# Patient Record
Sex: Male | Born: 1955
Health system: Southern US, Community
[De-identification: ages and names within clinical notes are randomized; demographics above are authoritative.]

## PROBLEM LIST (undated history)

## (undated) DIAGNOSIS — I10 Essential (primary) hypertension: Secondary | ICD-10-CM

## (undated) DIAGNOSIS — Z8739 Personal history of other diseases of the musculoskeletal system and connective tissue: Secondary | ICD-10-CM

## (undated) DIAGNOSIS — E118 Type 2 diabetes mellitus with unspecified complications: Secondary | ICD-10-CM

## (undated) DIAGNOSIS — R7303 Prediabetes: Secondary | ICD-10-CM

## (undated) DIAGNOSIS — E785 Hyperlipidemia, unspecified: Secondary | ICD-10-CM

## (undated) HISTORY — DX: Essential (primary) hypertension: I10

## (undated) HISTORY — DX: Type 2 diabetes mellitus with unspecified complications: E11.8

## (undated) HISTORY — DX: Personal history of other diseases of the musculoskeletal system and connective tissue: Z87.39

## (undated) HISTORY — PX: FRACTURE SURGERY: SHX138

## (undated) HISTORY — DX: Prediabetes: R73.03

## (undated) HISTORY — DX: Hyperlipidemia, unspecified: E78.5

---

## 1991-02-02 HISTORY — PX: VASECTOMY: SHX75

## 2010-03-02 ENCOUNTER — Ambulatory Visit: Payer: Self-pay | Admitting: Internal Medicine

## 2010-05-22 ENCOUNTER — Other Ambulatory Visit: Payer: Self-pay | Admitting: Surgery

## 2010-05-22 DIAGNOSIS — R590 Localized enlarged lymph nodes: Secondary | ICD-10-CM

## 2010-05-27 ENCOUNTER — Ambulatory Visit
Admission: RE | Admit: 2010-05-27 | Discharge: 2010-05-27 | Disposition: A | Discharge status: Home / Self Care | Payer: Federal, State, Local not specified - PPO | Source: Ambulatory Visit | Attending: Surgery | Admitting: Surgery

## 2010-05-27 ENCOUNTER — Other Ambulatory Visit: Payer: Self-pay | Admitting: Surgery

## 2010-05-27 DIAGNOSIS — R590 Localized enlarged lymph nodes: Secondary | ICD-10-CM

## 2015-04-18 ENCOUNTER — Encounter: Payer: Self-pay | Admitting: Family Medicine

## 2015-04-18 DIAGNOSIS — I152 Hypertension secondary to endocrine disorders: Secondary | ICD-10-CM | POA: Insufficient documentation

## 2015-04-18 DIAGNOSIS — Z8739 Personal history of other diseases of the musculoskeletal system and connective tissue: Secondary | ICD-10-CM | POA: Insufficient documentation

## 2015-04-18 DIAGNOSIS — I10 Essential (primary) hypertension: Secondary | ICD-10-CM | POA: Insufficient documentation

## 2015-04-18 DIAGNOSIS — E1159 Type 2 diabetes mellitus with other circulatory complications: Secondary | ICD-10-CM | POA: Insufficient documentation

## 2015-04-29 ENCOUNTER — Ambulatory Visit (INDEPENDENT_AMBULATORY_CARE_PROVIDER_SITE_OTHER): Payer: BLUE CROSS/BLUE SHIELD | Admitting: Family Medicine

## 2015-04-29 ENCOUNTER — Encounter: Payer: Self-pay | Admitting: Family Medicine

## 2015-04-29 VITALS — BP 128/74 | HR 68 | Temp 98.2°F | Resp 18 | Ht 72.0 in | Wt 278.0 lb

## 2015-04-29 DIAGNOSIS — Z0000 Encounter for general adult medical examination without abnormal findings: Secondary | ICD-10-CM

## 2015-04-29 DIAGNOSIS — Z7689 Persons encountering health services in other specified circumstances: Secondary | ICD-10-CM

## 2015-04-29 DIAGNOSIS — I10 Essential (primary) hypertension: Secondary | ICD-10-CM | POA: Diagnosis not present

## 2015-04-29 DIAGNOSIS — Z7189 Other specified counseling: Secondary | ICD-10-CM

## 2015-04-29 LAB — CBC WITH DIFFERENTIAL/PLATELET
Basophils Absolute: 0.1 10*3/uL (ref 0.0–0.1)
Basophils Relative: 1 % (ref 0–1)
Eosinophils Absolute: 0.2 10*3/uL (ref 0.0–0.7)
Eosinophils Relative: 3 % (ref 0–5)
HCT: 47.2 % (ref 39.0–52.0)
Hemoglobin: 16.5 g/dL (ref 13.0–17.0)
Lymphocytes Relative: 32 % (ref 12–46)
Lymphs Abs: 2 10*3/uL (ref 0.7–4.0)
MCH: 32.7 pg (ref 26.0–34.0)
MCHC: 35 g/dL (ref 30.0–36.0)
MCV: 93.5 fL (ref 78.0–100.0)
MPV: 10.3 fL (ref 8.6–12.4)
Monocytes Absolute: 0.5 10*3/uL (ref 0.1–1.0)
Monocytes Relative: 8 % (ref 3–12)
Neutro Abs: 3.4 10*3/uL (ref 1.7–7.7)
Neutrophils Relative %: 56 % (ref 43–77)
Platelets: 195 10*3/uL (ref 150–400)
RBC: 5.05 MIL/uL (ref 4.22–5.81)
RDW: 12.8 % (ref 11.5–15.5)
WBC: 6.1 10*3/uL (ref 4.0–10.5)

## 2015-04-29 LAB — COMPLETE METABOLIC PANEL WITH GFR
ALT: 32 U/L (ref 9–46)
AST: 26 U/L (ref 10–35)
Albumin: 4.2 g/dL (ref 3.6–5.1)
Alkaline Phosphatase: 77 U/L (ref 40–115)
BUN: 18 mg/dL (ref 7–25)
CO2: 24 mmol/L (ref 20–31)
Calcium: 9.4 mg/dL (ref 8.6–10.3)
Chloride: 106 mmol/L (ref 98–110)
Creat: 1.06 mg/dL (ref 0.70–1.33)
GFR, Est African American: >89 mL/min (ref >=60)
GFR, Est Non African American: 78 mL/min (ref >=60)
Glucose, Bld: 91 mg/dL (ref 70–99)
Potassium: 4.2 mmol/L (ref 3.5–5.3)
Sodium: 143 mmol/L (ref 135–146)
Total Bilirubin: 0.6 mg/dL (ref 0.2–1.2)
Total Protein: 6.5 g/dL (ref 6.1–8.1)

## 2015-04-29 LAB — LIPID PANEL
Cholesterol: 174 mg/dL (ref 125–200)
HDL: 51 mg/dL (ref >=40)
LDL Cholesterol: 108 mg/dL (ref <130)
Total CHOL/HDL Ratio: 3.4 Ratio (ref <=5.0)
Triglycerides: 74 mg/dL (ref <150)
VLDL: 15 mg/dL (ref <30)

## 2015-04-29 NOTE — Progress Notes (Signed)
Subjective:    Patient ID: Andrew BathRobert C Cross, male    DOB: 08/27/1955, 60 y.o.   MRN: [id number]  HPI Patient is here today to establish care. He has never had a colonoscopy. Tetanus shot is up-to-date. He is not due for prostate exam or PSA until September. Past medical history is significant for hypertension. He admits that his blood pressure at home is ranging between 140 and 150/80-90. He denies any chest pain shortness of breath or dyspnea on exertion. He also has a history of gout. However he has less than 2 flares per year. At the present time he is able to manage it with early and aggressive colchicine. Otherwise he is doing well with no concerns. Past Medical History  Diagnosis Date  . Hypertension   . H/O: gout    Past Surgical History  Procedure Laterality Date  . Vasectomy  1993  . Fracture surgery  1970 / 1980    left forearm / right ankle   Current Outpatient Prescriptions on File Prior to Visit  Medication Sig Dispense Refill  . aspirin 81 MG tablet Take 81 mg by mouth daily.    . colchicine 0.6 MG tablet Take 0.6 mg by mouth daily.    . Esomeprazole Magnesium (NEXIUM PO) Take 22.3 mg by mouth daily.    Marland Kitchen. ibuprofen (ADVIL,MOTRIN) 200 MG tablet Take 200 mg by mouth as needed.    . indomethacin (INDOCIN) 50 MG capsule Take 50 mg by mouth as needed (for gout flare up).    . metoprolol succinate (TOPROL-XL) 50 MG 24 hr tablet Take 50 mg by mouth daily. Take with or immediately following a meal.    . naproxen sodium (ALEVE) 220 MG tablet Take 220 mg by mouth as needed.    . ramipril (ALTACE) 5 MG capsule Take 5 mg by mouth 2 (two) times daily.     No current facility-administered medications on file prior to visit.   No Known Allergies Social History   Social History  . Marital Status: Married    Spouse Name: N/A  . Number of Children: N/A  . Years of Education: N/A   Occupational History  . Not on file.   Social History Main Topics  . Smoking status: Never  Smoker   . Smokeless tobacco: Never Used  . Alcohol Use: Yes     Comment: 2 mixed drinks a month  . Drug Use: No  . Sexual Activity: Not Currently    Birth Control/ Protection: Surgical   Other Topics Concern  . Not on file   Social History Narrative   Family History  Problem Relation Age of Onset  . Hypertension Mother   . Hypertension Father   . Hypertension Sister   . Arthritis Maternal Grandmother   . Hypertension Maternal Grandmother   . Hypertension Maternal Grandfather   . Hypertension Paternal Grandmother   . Alcohol abuse Paternal Grandfather   . Hypertension Paternal Grandfather       Review of Systems     Objective:   Physical Exam  Constitutional: He is oriented to person, place, and time. He appears well-developed and well-nourished. No distress.  HENT:  Head: Normocephalic and atraumatic.  Right Ear: External ear normal.  Left Ear: External ear normal.  Nose: Nose normal.  Mouth/Throat: Oropharynx is clear and moist. No oropharyngeal exudate.  Eyes: Conjunctivae and EOM are normal. Pupils are equal, round, and reactive to light. Right eye exhibits no discharge. Left eye exhibits no discharge. No scleral  icterus.  Neck: Normal range of motion. Neck supple. No JVD present. No tracheal deviation present. No thyromegaly present.  Cardiovascular: Normal rate, regular rhythm, normal heart sounds and intact distal pulses.  Exam reveals no gallop and no friction rub.   No murmur heard. Pulmonary/Chest: Effort normal and breath sounds normal. No stridor. No respiratory distress. He has no wheezes. He has no rales. He exhibits no tenderness.  Abdominal: Soft. Bowel sounds are normal. He exhibits no distension and no mass. There is no tenderness. There is no rebound and no guarding.  Musculoskeletal: Normal range of motion. He exhibits no edema or tenderness.  Lymphadenopathy:    He has no cervical adenopathy.  Neurological: He is alert and oriented to person,  place, and time. He has normal reflexes. He displays normal reflexes. No cranial nerve deficit. He exhibits normal muscle tone. Coordination normal.  Skin: Skin is warm. No rash noted. He is not diaphoretic. No erythema. No pallor.  Psychiatric: He has a normal mood and affect. His behavior is normal. Judgment and thought content normal.  Vitals reviewed.         Assessment & Plan:  Establishing care with new doctor, encounter for  Benign essential HTN - Plan: CBC with Differential/Platelet, COMPLETE METABOLIC PANEL WITH GFR, Lipid panel  Routine general medical examination at a health care facility - Plan: CBC with Differential/Platelet, COMPLETE METABOLIC PANEL WITH GFR, Lipid panel, PSA  I recommend a colonoscopy. The patient would prefer Dr. Kinnie Scales.  However he would like to wait till later this year. Therefore I'll not schedule this until he asked me to. He is not due for digital rectal exam or PSA until September. I will check a CBC, CMP, fasting lipid panel. His goal LDL cholesterol is less than 130. His blood pressure sounds elevated at home. I've asked him to try to lose weight and exercise more. Recheck in 6 months. If persistently greater than 140, I would add amlodipine. He declines hepatitis C screening.

## 2015-10-31 ENCOUNTER — Ambulatory Visit: Payer: BLUE CROSS/BLUE SHIELD | Admitting: Family Medicine

## 2015-12-19 ENCOUNTER — Telehealth: Payer: Self-pay | Admitting: Family Medicine

## 2015-12-19 MED ORDER — COLCHICINE 0.6 MG PO TABS: ORAL_TABLET | ORAL | 0 refills | Status: DC

## 2015-12-19 NOTE — Telephone Encounter (Signed)
Pt calls and states that he is having a gout flare and would like to have colchicine called in for it. Per Dr. Jeanice Limurham ok to send in. Med sent to pharm.

## 2015-12-22 ENCOUNTER — Other Ambulatory Visit: Payer: Self-pay | Admitting: Family Medicine

## 2015-12-22 MED ORDER — COLCHICINE 0.6 MG PO TABS: ORAL_TABLET | ORAL | 0 refills | Status: DC

## 2015-12-23 ENCOUNTER — Encounter: Payer: Self-pay | Admitting: Family Medicine

## 2015-12-23 ENCOUNTER — Ambulatory Visit (INDEPENDENT_AMBULATORY_CARE_PROVIDER_SITE_OTHER): Payer: BLUE CROSS/BLUE SHIELD | Admitting: Family Medicine

## 2015-12-23 VITALS — BP 158/108 | HR 78 | Temp 98.4°F | Resp 18 | Ht 72.0 in | Wt 281.0 lb

## 2015-12-23 DIAGNOSIS — Z1211 Encounter for screening for malignant neoplasm of colon: Secondary | ICD-10-CM | POA: Diagnosis not present

## 2015-12-23 DIAGNOSIS — Z125 Encounter for screening for malignant neoplasm of prostate: Secondary | ICD-10-CM | POA: Diagnosis not present

## 2015-12-23 DIAGNOSIS — I10 Essential (primary) hypertension: Secondary | ICD-10-CM | POA: Diagnosis not present

## 2015-12-23 DIAGNOSIS — M7740 Metatarsalgia, unspecified foot: Secondary | ICD-10-CM

## 2015-12-23 MED ORDER — AMLODIPINE BESYLATE 5 MG PO TABS: 5.0000 mg | ORAL_TABLET | Freq: Every day | ORAL | 3 refills | Status: DC

## 2015-12-23 NOTE — Progress Notes (Signed)
Subjective:    Patient ID: Andrew Cross, male    DOB: 06/16/1955, 60 y.o.   MRN: [id number]  HPI Patient is here today to establish care. He has never had a colonoscopy. Tetanus shot is up-to-date. He is not due for prostate exam or PSA until September. Past medical history is significant for hypertension. He admits that his blood pressure at home is ranging between 140 and 150/80-90. He denies any chest pain shortness of breath or dyspnea on exertion. He also has a history of gout. However he has less than 2 flares per year. At the present time he is able to manage it with early and aggressive colchicine. Otherwise he is doing well with no concerns.  At that time, my plan was: I recommend a colonoscopy. The patient would prefer Dr. Kinnie ScalesMedoff.  However he would like to wait till later this year. Therefore I'll not schedule this until he asked me to. He is not due for digital rectal exam or PSA until September. I will check a CBC, CMP, fasting lipid panel. His goal LDL cholesterol is less than 130. His blood pressure sounds elevated at home. I've asked him to try to lose weight and exercise more. Recheck in 6 months. If persistently greater than 140, I would add amlodipine. He declines hepatitis C screening.  12/23/15 Patient is here today for a follow-up. #1 his blood pressure is extremely high still. Even on the ramp up her on metoprolol his blood pressure remains greater than 140 systolic. His diastolic blood pressures at home however much better than what they are here. He is also due for prostate cancer screening as well as a PSA. He is also due for a colonoscopy and he would like me to schedule that now that his work schedule is lighter. He also complains of pain in both feet at the third fourth metatarsal phalangeal joints. On examination he has collapsed transverse arches bilaterally and has tenderness to palpation at the metatarsal heads consistent with metatarsalgia. Past Medical History:    Diagnosis Date  . H/O: gout   . Hypertension    Past Surgical History:  Procedure Laterality Date  . FRACTURE SURGERY  1970 / 1980   left forearm / right ankle  . VASECTOMY  1993   Current Outpatient Prescriptions on File Prior to Visit  Medication Sig Dispense Refill  . aspirin 81 MG tablet Take 81 mg by mouth daily.    . colchicine 0.6 MG tablet Take 2 tabs po x 1, may take 1 tab 1 hour later if pain persists then 1 a day x 5 days 30 tablet 0  . Esomeprazole Magnesium (NEXIUM PO) Take 22.3 mg by mouth daily.    Marland Kitchen. ibuprofen (ADVIL,MOTRIN) 200 MG tablet Take 200 mg by mouth as needed.    . indomethacin (INDOCIN) 50 MG capsule Take 50 mg by mouth as needed (for gout flare up).    . metoprolol succinate (TOPROL-XL) 50 MG 24 hr tablet Take 50 mg by mouth daily. Take with or immediately following a meal.    . naproxen sodium (ALEVE) 220 MG tablet Take 220 mg by mouth as needed.    . ramipril (ALTACE) 5 MG capsule Take 5 mg by mouth 2 (two) times daily.     No current facility-administered medications on file prior to visit.    No Known Allergies Social History   Social History  . Marital status: Married    Spouse name: N/A  . Number of children:  N/A  . Years of education: N/A   Occupational History  . Not on file.   Social History Main Topics  . Smoking status: Never Smoker  . Smokeless tobacco: Never Used  . Alcohol use Yes     Comment: 2 mixed drinks a month  . Drug use: No  . Sexual activity: Not Currently    Birth control/ protection: Surgical   Other Topics Concern  . Not on file   Social History Narrative  . No narrative on file   Family History  Problem Relation Age of Onset  . Hypertension Mother   . Hypertension Father   . Hypertension Sister   . Arthritis Maternal Grandmother   . Hypertension Maternal Grandmother   . Hypertension Maternal Grandfather   . Hypertension Paternal Grandmother   . Alcohol abuse Paternal Grandfather   . Hypertension  Paternal Grandfather       Review of Systems     Objective:   Physical Exam  Constitutional: He is oriented to person, place, and time. He appears well-developed and well-nourished. No distress.  HENT:  Head: Normocephalic and atraumatic.  Right Ear: External ear normal.  Left Ear: External ear normal.  Nose: Nose normal.  Mouth/Throat: Oropharynx is clear and moist. No oropharyngeal exudate.  Eyes: Conjunctivae and EOM are normal. Pupils are equal, round, and reactive to light. Right eye exhibits no discharge. Left eye exhibits no discharge. No scleral icterus.  Neck: Normal range of motion. Neck supple. No JVD present. No tracheal deviation present. No thyromegaly present.  Cardiovascular: Normal rate, regular rhythm, normal heart sounds and intact distal pulses.  Exam reveals no gallop and no friction rub.   No murmur heard. Pulmonary/Chest: Effort normal and breath sounds normal. No stridor. No respiratory distress. He has no wheezes. He has no rales. He exhibits no tenderness.  Abdominal: Soft. Bowel sounds are normal. He exhibits no distension and no mass. There is no tenderness. There is no rebound and no guarding.  Genitourinary: Prostate normal. Prostate is not enlarged and not tender.  Musculoskeletal: Normal range of motion. He exhibits no edema or tenderness.  Lymphadenopathy:    He has no cervical adenopathy.  Neurological: He is alert and oriented to person, place, and time. He has normal reflexes. No cranial nerve deficit. He exhibits normal muscle tone. Coordination normal.  Skin: Skin is warm. No rash noted. He is not diaphoretic. No erythema. No pallor.  Psychiatric: He has a normal mood and affect. His behavior is normal. Judgment and thought content normal.  Vitals reviewed.         Assessment & Plan:  Special screening, prostate cancer - Plan: BASIC METABOLIC PANEL WITH GFR, PSA  Benign essential HTN  Colon cancer screening - Plan: Ambulatory referral to  Gastroenterology  Metatarsalgia, unspecified laterality - Plan: Ambulatory referral to Podiatry  Prostate exam today is normal at a PSA. Blood pressure still elevated, add amlodipine and recheck blood pressure in 2 weeks continue ramipril and metoprolol. Schedule the patient for a GI consultation for colon cancer screening. Consult podiatry for orthotics to address the collapsed transverse arch and to help treat his metatarsalgia.

## 2015-12-24 LAB — BASIC METABOLIC PANEL WITH GFR
BUN: 19 mg/dL (ref 7–25)
CO2: 25 mmol/L (ref 20–31)
Calcium: 9.1 mg/dL (ref 8.6–10.3)
Chloride: 105 mmol/L (ref 98–110)
Creat: 1.07 mg/dL (ref 0.70–1.33)
GFR, Est African American: 89 mL/min (ref >=60)
GFR, Est Non African American: 77 mL/min (ref >=60)
Glucose, Bld: 105 mg/dL — ABNORMAL HIGH (ref 70–99)
Potassium: 4.2 mmol/L (ref 3.5–5.3)
Sodium: 142 mmol/L (ref 135–146)

## 2015-12-24 LAB — PSA: PSA: 1 ng/mL (ref <=4.0)

## 2016-01-05 ENCOUNTER — Encounter: Payer: Self-pay | Admitting: Podiatry

## 2016-01-05 ENCOUNTER — Ambulatory Visit (INDEPENDENT_AMBULATORY_CARE_PROVIDER_SITE_OTHER): Payer: BLUE CROSS/BLUE SHIELD | Admitting: Podiatry

## 2016-01-05 ENCOUNTER — Ambulatory Visit (INDEPENDENT_AMBULATORY_CARE_PROVIDER_SITE_OTHER): Payer: BLUE CROSS/BLUE SHIELD

## 2016-01-05 VITALS — BP 147/96 | HR 90 | Resp 16 | Ht 72.0 in | Wt 279.0 lb

## 2016-01-05 DIAGNOSIS — M79671 Pain in right foot: Secondary | ICD-10-CM

## 2016-01-05 DIAGNOSIS — M79672 Pain in left foot: Secondary | ICD-10-CM | POA: Diagnosis not present

## 2016-01-05 DIAGNOSIS — M2041 Other hammer toe(s) (acquired), right foot: Secondary | ICD-10-CM

## 2016-01-05 DIAGNOSIS — M216X9 Other acquired deformities of unspecified foot: Secondary | ICD-10-CM

## 2016-01-05 NOTE — Progress Notes (Signed)
   Subjective:    Patient ID: Rosey BathRobert C Crilly, male    DOB: 03/12/1955, 60 y.o.   MRN: [id number]  HPI Chief Complaint  Patient presents with  . Toe Pain    Right foot; 2nd toe  . Foot Pain    Right foot; plantar forefoot; pt stated, "Feels like stepping on a rock; foot feels like it is tingling"; x1+ yrs      Review of Systems  All other systems reviewed and are negative.      Objective:   Physical Exam        Assessment & Plan:

## 2016-01-07 ENCOUNTER — Telehealth: Payer: Self-pay | Admitting: Family Medicine

## 2016-01-07 MED ORDER — RAMIPRIL 5 MG PO CAPS: 5.0000 mg | ORAL_CAPSULE | Freq: Two times a day (BID) | ORAL | 1 refills | Status: DC

## 2016-01-07 MED ORDER — METOPROLOL SUCCINATE ER 50 MG PO TB24: 50.0000 mg | ORAL_TABLET | Freq: Every day | ORAL | 1 refills | Status: DC

## 2016-01-07 NOTE — Telephone Encounter (Signed)
Medication called/sent to requested pharmacy  

## 2016-01-07 NOTE — Telephone Encounter (Signed)
[facility] [place]  Patient needs refill on his metoprolol and ramipril [phone]

## 2016-01-07 NOTE — Progress Notes (Signed)
Subjective:     Patient ID: Andrew BathRobert C Cross, male   DOB: 05/13/1955, 60 y.o.   MRN: [id number]  HPI patient states he's having a lot of pain in the second joint of his right foot and the toe has really lifted in the air and is rigid and it's contracture and becoming increasingly painful with ambulation   Review of Systems  All other systems reviewed and are negative.      Objective:   Physical Exam  Constitutional: He is oriented to person, place, and time.  Cardiovascular: Intact distal pulses.   Musculoskeletal: Normal range of motion.  Neurological: He is oriented to person, place, and time.  Skin: Skin is warm.  Nursing note and vitals reviewed.  neurovascular status intact muscle strength adequate range of motion within normal limits with patient noted to have acute inflammation of the right second metatarsal phalangeal joint with rigid contracture of the second digit and pain dorsal on the toe. 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 I do think long-term due to the long-standing nature and the rigid contracture of the toe but digital fusion along with metatarsal shortening osteotomy is indicated. Patient wants to do this but needs to check his schedule and at this time I've advised him on more rigid tight shoe gear not going barefoot and padding  X-ray report indicates rigid contracture digit 2 right pressing against the second metatarsal right with no indications of advanced arthritis and left foot not showing the same deformity

## 2016-01-21 ENCOUNTER — Other Ambulatory Visit: Payer: Self-pay | Admitting: Family Medicine

## 2016-01-21 MED ORDER — ALLOPURINOL 100 MG PO TABS: 100.0000 mg | ORAL_TABLET | Freq: Every day | ORAL | 3 refills | Status: DC

## 2016-01-28 ENCOUNTER — Other Ambulatory Visit: Payer: Self-pay | Admitting: Family Medicine

## 2016-01-28 MED ORDER — ALLOPURINOL 100 MG PO TABS: 100.0000 mg | ORAL_TABLET | Freq: Every day | ORAL | 3 refills | Status: DC

## 2016-06-10 ENCOUNTER — Other Ambulatory Visit: Payer: Self-pay | Admitting: Family Medicine

## 2016-07-09 ENCOUNTER — Other Ambulatory Visit: Payer: Self-pay | Admitting: Family Medicine

## 2016-12-02 ENCOUNTER — Other Ambulatory Visit: Payer: Self-pay | Admitting: Family Medicine

## 2016-12-22 ENCOUNTER — Other Ambulatory Visit: Payer: Self-pay | Admitting: Family Medicine

## 2017-01-14 ENCOUNTER — Other Ambulatory Visit: Payer: Self-pay | Admitting: Family Medicine

## 2017-01-18 ENCOUNTER — Emergency Department (HOSPITAL_COMMUNITY): Payer: BLUE CROSS/BLUE SHIELD

## 2017-01-18 ENCOUNTER — Other Ambulatory Visit: Payer: Self-pay

## 2017-01-18 ENCOUNTER — Encounter (HOSPITAL_COMMUNITY): Payer: Self-pay | Admitting: Emergency Medicine

## 2017-01-18 ENCOUNTER — Other Ambulatory Visit: Payer: Self-pay | Admitting: Family Medicine

## 2017-01-18 ENCOUNTER — Emergency Department (HOSPITAL_COMMUNITY)
Admission: EM | Admit: 2017-01-18 | Discharge: 2017-01-18 | Disposition: A | Discharge status: Home / Self Care | Payer: BLUE CROSS/BLUE SHIELD | Attending: Emergency Medicine | Admitting: Emergency Medicine

## 2017-01-18 DIAGNOSIS — Z79899 Other long term (current) drug therapy: Secondary | ICD-10-CM | POA: Diagnosis not present

## 2017-01-18 DIAGNOSIS — M25562 Pain in left knee: Secondary | ICD-10-CM | POA: Insufficient documentation

## 2017-01-18 DIAGNOSIS — Z7982 Long term (current) use of aspirin: Secondary | ICD-10-CM | POA: Insufficient documentation

## 2017-01-18 DIAGNOSIS — I10 Essential (primary) hypertension: Secondary | ICD-10-CM | POA: Diagnosis not present

## 2017-01-18 MED ORDER — TRAMADOL HCL 50 MG PO TABS: 50.0000 mg | ORAL_TABLET | Freq: Four times a day (QID) | ORAL | 0 refills | Status: DC | PRN

## 2017-01-18 NOTE — ED Provider Notes (Signed)
[facility] Provider Note   CSN: [id number] Arrival date & time: 01/18/17  1332     History   Chief Complaint Chief Complaint  Patient presents with  . Knee Pain    HPI Andrew Cross is a 61 y.o. male.  HPI 61 year old Caucasian male past medical history significant for hypertension and gout presents to the emergency department today with complaints of left knee pain.  Patient states that he was on a job site today when his right leg slipped out from under him and his left leg bent back with hyper flexion and fell to the ground.  Patient states that he heard a pop in his left knee.  Reports some pain above his left knee.  Patient has been ambulatory but with pain.  States the pain is right above the left knee.  Reports swelling to the left knee.  Denies any associated paresthesias or weakness.  He is not taking for the pain prior to arrival.  He denies wanting any pain medicine at this time.  Reports pain with range of motion and palpation.  Denies head injury or LOC. Past Medical History:  Diagnosis Date  . H/O: gout   . Hypertension     Patient Active Problem List   Diagnosis Date Noted  . Hypertension   . H/O: gout     Past Surgical History:  Procedure Laterality Date  . FRACTURE SURGERY  1970 / 1980   left forearm / right ankle  . VASECTOMY  1993       Home Medications    Prior to Admission medications   Medication Sig Start Date End Date Taking? Authorizing Provider  allopurinol (ZYLOPRIM) 100 MG tablet Take 1 tablet (100 mg total) by mouth daily. 01/28/16   Donita BrooksPickard, Warren T, MD  amLODipine (NORVASC) 5 MG tablet Take 1 tablet (5 mg total) by mouth daily. 12/23/15   Donita BrooksPickard, Warren T, MD  aspirin 81 MG tablet Take 81 mg by mouth daily.    [provider]  COLCRYS 0.6 MG tablet TAKE 2 TABS ONCE, MAY TAKE 1 TAB 1 HOUR LATER IF PAIN PERSISTS THEN 1 A DAY X 5 DAYS 01/14/17   Donita BrooksPickard, Warren T, MD  Esomeprazole  Magnesium (NEXIUM PO) Take 22.3 mg by mouth daily.    [provider]  ibuprofen (ADVIL,MOTRIN) 200 MG tablet Take 200 mg by mouth as needed.    [provider]  indomethacin (INDOCIN) 50 MG capsule Take 50 mg by mouth as needed (for gout flare up).    [provider]  metoprolol succinate (TOPROL-XL) 50 MG 24 hr tablet TAKE 1 TABLET (50 MG TOTAL) BY MOUTH DAILY. TAKE WITH OR IMMEDIATELY FOLLOWING A MEAL. 06/10/16   Donita BrooksPickard, Warren T, MD  naproxen sodium (ALEVE) 220 MG tablet Take 220 mg by mouth as needed.    [provider]  ramipril (ALTACE) 5 MG capsule TAKE 1 CAPSULE (5 MG TOTAL) BY MOUTH 2 (TWO) TIMES DAILY. 12/02/16   Donita BrooksPickard, Warren T, MD  traMADol (ULTRAM) 50 MG tablet Take 1 tablet (50 mg total) by mouth every 6 (six) hours as needed. 01/18/17   Rise MuLeaphart, Kenneth T, PA-C    Family History Family History  Problem Relation Age of Onset  . Hypertension Mother   . Hypertension Father   . Hypertension Sister   . Arthritis Maternal Grandmother   . Hypertension Maternal Grandmother   . Hypertension Maternal Grandfather   . Hypertension Paternal Grandmother   .  Alcohol abuse Paternal Grandfather   . Hypertension Paternal Grandfather     Social History Social History   Tobacco Use  . Smoking status: Never Smoker  . Smokeless tobacco: Never Used  Substance Use Topics  . Alcohol use: Yes    Comment: 2 mixed drinks a month  . Drug use: No     Allergies   Patient has no known allergies.   Review of Systems Review of Systems  Constitutional: Negative for fever.  Musculoskeletal: Positive for arthralgias, joint swelling and myalgias.  Skin: Negative for color change.  Neurological: Negative for weakness and numbness.     Physical Exam Updated Vital Signs BP (!) 186/120 (BP Location: Left Arm)   Pulse (!) 117   Temp 97.9 F (36.6 C) (Oral)   Resp 16   SpO2 98%   Physical Exam  Constitutional: He is oriented to person, place, and  time. He appears well-developed and well-nourished. No distress.  HENT:  Head: Normocephalic and atraumatic.  Eyes: Right eye exhibits no discharge. Left eye exhibits no discharge. No scleral icterus.  Neck: Normal range of motion.  Pulmonary/Chest: No respiratory distress.  Musculoskeletal:       Left knee: He exhibits decreased range of motion, swelling, effusion and abnormal meniscus. He exhibits no ecchymosis, no deformity, no laceration, no erythema, normal alignment, no LCL laxity, normal patellar mobility, no bony tenderness and no MCL laxity. Tenderness found. Patellar tendon tenderness noted. No medial joint line, no lateral joint line, no MCL and no LCL tenderness noted.  Patient with moderate effusion noted.  No associated erythema, warmth, ecchymosis.  Mildly tender to palpation of the patellar.  There is some joint laxity with varus stress.  Negative anterior drawer test.  Possible meniscal sign.  DP pulses are 2+ bilaterally.  Sensation intact in all dermatomes.  Cap refill is normal.  Strength 5 out of 5 in lower extremities.  Neurological: He is alert and oriented to person, place, and time.  Skin: Skin is warm and dry. Capillary refill takes less than 2 seconds. No pallor.  Psychiatric: His behavior is normal. Judgment and thought content normal.  Nursing note and vitals reviewed.    ED Treatments / Results  Labs (all labs ordered are listed, but only abnormal results are displayed) Labs Reviewed - No data to display  EKG  EKG Interpretation None       Radiology Dg Knee Complete 4 Views Left  Result Date: 01/18/2017 CLINICAL DATA:  Acute left knee pain after injury. EXAM: LEFT KNEE - COMPLETE 4+ VIEW COMPARISON:  None. FINDINGS: No fracture or dislocation is noted. Probable moderate suprapatellar joint effusion is noted. Joint spaces are intact. IMPRESSION: Probable moderate suprapatellar joint effusion. No fracture or dislocation is noted. Electronically Signed   By:  Lupita Raider, M.D.   On: 01/18/2017 15:12    Procedures Procedures (including critical care time)  Medications Ordered in ED Medications - No data to display   Initial Impression / Assessment and Plan / ED Course  I have reviewed the triage vital signs and the nursing notes.  Pertinent labs & imaging results that were available during my care of the patient were reviewed by me and considered in my medical decision making (see chart for details).     Patient presents the ED with complaints of left knee pain after mechanical injury prior to arrival.  Patient has effusion noted.  He is neurovascularly intact.  X-ray shows no bony abnormality but does show a  joint effusion.  Suspect ligament injury.  Patient has an appointment with orthopedic doctor at 8:00 in the morning.  Patient is not on blood thinners.  Reports history of gout but there is no erythema or warmth over the joint that is concerning for gout arthritis or septic arthritis.  This is likely a traumatic effusion.  Discussed with patient that we can tap the joint to relieve pressure however this is at risk for infection.  Patient would like to avoid at this time.  Patient X-Ray negative for obvious fracture or dislocation. Pain managed in ED. Pt advised to follow up with orthopedics if symptoms persist for possibility of missed fracture diagnosis. Patient given brace while in ED, conservative therapy recommended and discussed. Patient will be dc home & is agreeable with above plan.   Final Clinical Impressions(s) / ED Diagnoses   Final diagnoses:  Acute pain of left knee    ED Discharge Orders        Ordered    traMADol (ULTRAM) 50 MG tablet  Every 6 hours PRN     01/18/17 1545       Rise MuLeaphart, Kenneth T, PA-C 01/18/17 1553    Rise MuLeaphart, Kenneth T, PA-C 01/18/17 1554    Gerhard MunchLockwood, Lorenza, MD 01/18/17 1627

## 2017-01-18 NOTE — Discharge Instructions (Signed)
You do have some the fluid on your knee joint from your fall today.  I would recommend using the Ace wrap and knee immobilizer.  Use the crutches.  Make sure you are resting, icing, elevating the left leg.  Follow-up with the orthopedic appointment tomorrow morning at 10:00.  If you develop any loss of sensation in your left foot, decreased capillary refill return to the ED immediately.

## 2017-01-18 NOTE — ED Triage Notes (Signed)
Working on job site and hurt  Left knee slipped and it popped  Cannot lift leg

## 2017-03-04 ENCOUNTER — Other Ambulatory Visit: Payer: Self-pay | Admitting: Family Medicine

## 2017-05-20 ENCOUNTER — Other Ambulatory Visit: Payer: Self-pay | Admitting: Family Medicine

## 2017-05-25 ENCOUNTER — Other Ambulatory Visit: Payer: Self-pay | Admitting: Family Medicine

## 2017-06-10 ENCOUNTER — Other Ambulatory Visit: Payer: Self-pay | Admitting: Family Medicine

## 2017-08-02 ENCOUNTER — Other Ambulatory Visit: Payer: Self-pay | Admitting: Family Medicine

## 2017-08-26 ENCOUNTER — Other Ambulatory Visit: Payer: Self-pay | Admitting: Family Medicine

## 2017-08-26 MED ORDER — METOPROLOL SUCCINATE ER 50 MG PO TB24: ORAL_TABLET | ORAL | 0 refills | Status: DC

## 2017-08-26 MED ORDER — AMLODIPINE BESYLATE 5 MG PO TABS: ORAL_TABLET | ORAL | 0 refills | Status: DC

## 2017-08-26 MED ORDER — RAMIPRIL 5 MG PO CAPS: 5.0000 mg | ORAL_CAPSULE | Freq: Two times a day (BID) | ORAL | 0 refills | Status: DC

## 2017-09-22 ENCOUNTER — Other Ambulatory Visit: Payer: Self-pay | Admitting: Family Medicine

## 2017-10-16 ENCOUNTER — Other Ambulatory Visit: Payer: Self-pay | Admitting: Family Medicine

## 2017-11-10 ENCOUNTER — Ambulatory Visit: Payer: BLUE CROSS/BLUE SHIELD | Admitting: Family Medicine

## 2017-11-10 ENCOUNTER — Encounter: Payer: Self-pay | Admitting: Family Medicine

## 2017-11-10 VITALS — BP 176/100 | HR 80 | Temp 98.1°F | Resp 16 | Ht 72.0 in | Wt 276.0 lb

## 2017-11-10 DIAGNOSIS — I10 Essential (primary) hypertension: Secondary | ICD-10-CM | POA: Diagnosis not present

## 2017-11-10 DIAGNOSIS — Z8739 Personal history of other diseases of the musculoskeletal system and connective tissue: Secondary | ICD-10-CM | POA: Diagnosis not present

## 2017-11-10 DIAGNOSIS — Z23 Encounter for immunization: Secondary | ICD-10-CM | POA: Diagnosis not present

## 2017-11-10 DIAGNOSIS — Z125 Encounter for screening for malignant neoplasm of prostate: Secondary | ICD-10-CM | POA: Diagnosis not present

## 2017-11-10 MED ORDER — HYDROCHLOROTHIAZIDE 25 MG PO TABS: 25.0000 mg | ORAL_TABLET | Freq: Every day | ORAL | 3 refills | Status: DC

## 2017-11-10 NOTE — Progress Notes (Signed)
Subjective:    Patient ID: Andrew Cross, male    DOB: Apr 09, 1955, 62 y.o.   MRN: [id number]  HPI Patient is here today for hypertension.  He states that he has not been taking his blood pressure medicine consistently.  However he states that he took all 3 blood pressure medicines the last 2 days and his blood pressure today is still elevated at 176/100.  He checks his blood pressure at home and finds the systolic values to be between 962 and 150.  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  Therefore it does not sound like his blood pressure is adequately controlled.  He discontinued allopurinol and colchicine more than a year ago.  He states that he has not had a gout attack in that time.  He has been able to control his gout by taking tart cherry juice and avoiding shellfish.  He denies any chest pain shortness of breath or dyspnea on exertion.  He is due for a flu shot Past Medical History:  Diagnosis Date  . H/O: gout   . Hypertension    Past Surgical History:  Procedure Laterality Date  . FRACTURE SURGERY  1970 / 1980   left forearm / right ankle  . VASECTOMY  1993   Current Outpatient Medications on File Prior to Visit  Medication Sig Dispense Refill  . amLODipine (NORVASC) 5 MG tablet TAKE 1 TABLET BY MOUTH EVERY DAY. *NEEDS O/V FOR FURTHER REFILLS* 30 tablet 0  . COLCRYS 0.6 MG tablet TAKE 2 TABS ONCE, MAY TAKE 1 TAB 1 HOUR LATER IF PAIN PERSISTS THEN 1 A DAY X 5 DAYS 30 tablet 0  . Esomeprazole Magnesium (NEXIUM PO) Take 22.3 mg by mouth daily.    Marland Kitchen ibuprofen (ADVIL,MOTRIN) 200 MG tablet Take 200 mg by mouth as needed.    . metoprolol succinate (TOPROL-XL) 50 MG 24 hr tablet TAKE 1 TABLET (50 MG TOTAL) BY MOUTH DAILY. TAKE WITH OR IMMEDIATELY FOLLOWING A MEAL. *NEED O/V* 30 tablet 0  . ramipril (ALTACE) 5 MG capsule Take 1 capsule (5 mg total) by mouth 2 (two) times daily. 60 capsule 0   No current facility-administered medications on  file prior to visit.     No Known Allergies Social History   Socioeconomic History  . Marital status: Married    Spouse name: Not on file  . Number of children: Not on file  . Years of education: Not on file  . Highest education level: Not on file  Occupational History  . Not on file  Social Needs  . Financial resource strain: Not on file  . Food insecurity:    Worry: Not on file    Inability: Not on file  . Transportation needs:    Medical: Not on file    Non-medical: Not on file  Tobacco Use  . Smoking status: Never Smoker  . Smokeless tobacco: Never Used  Substance and Sexual Activity  . Alcohol use: Yes    Comment: 2 mixed drinks a month  . Drug use: No  . Sexual activity: Not Currently    Birth control/protection: Surgical  Lifestyle  . Physical activity:    Days per week: Not on file    Minutes per session: Not on file  . Stress: Not on file  Relationships  . Social connections:    Talks on phone: Not on file    Gets together: Not on file    Attends religious  service: Not on file    Active member of club or organization: Not on file    Attends meetings of clubs or organizations: Not on file    Relationship status: Not on file  . Intimate partner violence:    Fear of current or ex partner: Not on file    Emotionally abused: Not on file    Physically abused: Not on file    Forced sexual activity: Not on file  Other Topics Concern  . Not on file  Social History Narrative  . Not on file   Family History  Problem Relation Age of Onset  . Hypertension Mother   . Hypertension Father   . Hypertension Sister   . Arthritis Maternal Grandmother   . Hypertension Maternal Grandmother   . Hypertension Maternal Grandfather   . Hypertension Paternal Grandmother   . Alcohol abuse Paternal Grandfather   . Hypertension Paternal Grandfather       Review of Systems     Objective:   Physical Exam  Constitutional: He is oriented to person, place, and time. He  appears well-developed and well-nourished. No distress.  HENT:  Head: Normocephalic and atraumatic.  Right Ear: External ear normal.  Left Ear: External ear normal.  Nose: Nose normal.  Mouth/Throat: Oropharynx is clear and moist. No oropharyngeal exudate.  Eyes: Pupils are equal, round, and reactive to light. Conjunctivae and EOM are normal. Right eye exhibits no discharge. Left eye exhibits no discharge. No scleral icterus.  Neck: Normal range of motion. Neck supple. No JVD present. No tracheal deviation present. No thyromegaly present.  Cardiovascular: Normal rate, regular rhythm, normal heart sounds and intact distal pulses. Exam reveals no gallop and no friction rub.  No murmur heard. Pulmonary/Chest: Effort normal and breath sounds normal. No stridor. No respiratory distress. He has no wheezes. He has no rales. He exhibits no tenderness.  Abdominal: Soft. Bowel sounds are normal. He exhibits no distension and no mass. There is no tenderness. There is no rebound and no guarding.  Musculoskeletal: Normal range of motion. He exhibits no edema or tenderness.  Lymphadenopathy:    He has no cervical adenopathy.  Neurological: He is alert and oriented to person, place, and time. He has normal reflexes. No cranial nerve deficit. He exhibits normal muscle tone. Coordination normal.  Skin: Skin is warm. No rash noted. He is not diaphoretic. No erythema. No pallor.  Psychiatric: He has a normal mood and affect. His behavior is normal. Judgment and thought content normal.  Vitals reviewed.         Assessment & Plan:  Benign essential HTN - Plan: CBC with Differential/Platelet, COMPLETE METABOLIC PANEL WITH GFR, Lipid panel  H/O: gout - Plan: Uric acid  Special screening, prostate cancer - Plan: PSA Blood pressure is elevated today.  We discussed adding hydrochlorothiazide which could trigger gout exacerbations or adding doxazosin which could cause orthostatic dizziness.  Ultimately we  decided to try hydrochlorothiazide.  I would like to recheck his blood pressure in 2 to 3 weeks.  I want to keep his systolic blood pressure less than 140.  Check a uric acid level to evaluate his risk of future gout exacerbations.  Screen for prostate cancer with a PSA.  Patient received his flu shot today.  If he is unable to tolerate hydrochlorothiazide, I would next try doxazosin.  Encourage the patient uses medication consistently.  I will also check a fasting lipid panel

## 2017-11-10 NOTE — Addendum Note (Signed)
Addended by: Legrand Rams B on: 11/10/2017 12:52 PM   Modules accepted: Orders

## 2017-11-12 ENCOUNTER — Other Ambulatory Visit: Payer: Self-pay | Admitting: Family Medicine

## 2017-11-15 ENCOUNTER — Encounter: Payer: Self-pay | Admitting: Family Medicine

## 2017-11-15 LAB — LIPID PANEL
Cholesterol: 187 mg/dL (ref <200)
HDL: 50 mg/dL (ref >40)
LDL Cholesterol (Calc): 109 mg/dL (calc) — ABNORMAL HIGH
Non-HDL Cholesterol (Calc): 137 mg/dL (calc) — ABNORMAL HIGH (ref <130)
Total CHOL/HDL Ratio: 3.7 (calc) (ref <5.0)
Triglycerides: 161 mg/dL — ABNORMAL HIGH (ref <150)

## 2017-11-15 LAB — CBC WITH DIFFERENTIAL/PLATELET
Basophils Absolute: 73 cells/uL (ref 0–200)
Basophils Relative: 1 %
Eosinophils Absolute: 380 cells/uL (ref 15–500)
Eosinophils Relative: 5.2 %
HCT: 47.1 % (ref 38.5–50.0)
Hemoglobin: 16.5 g/dL (ref 13.2–17.1)
Lymphs Abs: 2066 cells/uL (ref 850–3900)
MCH: 33.3 pg — ABNORMAL HIGH (ref 27.0–33.0)
MCHC: 35 g/dL (ref 32.0–36.0)
MCV: 95.2 fL (ref 80.0–100.0)
MPV: 10.6 fL (ref 7.5–12.5)
Monocytes Relative: 9.9 %
Neutro Abs: 4059 cells/uL (ref 1500–7800)
Neutrophils Relative %: 55.6 %
Platelets: 194 10*3/uL (ref 140–400)
RBC: 4.95 10*6/uL (ref 4.20–5.80)
RDW: 12.3 % (ref 11.0–15.0)
Total Lymphocyte: 28.3 %
WBC mixed population: 723 cells/uL (ref 200–950)
WBC: 7.3 10*3/uL (ref 3.8–10.8)

## 2017-11-15 LAB — COMPLETE METABOLIC PANEL WITH GFR
AG Ratio: 2.3 (calc) (ref 1.0–2.5)
ALT: 44 U/L (ref 9–46)
AST: 32 U/L (ref 10–35)
Albumin: 4.5 g/dL (ref 3.6–5.1)
Alkaline phosphatase (APISO): 95 U/L (ref 40–115)
BUN: 19 mg/dL (ref 7–25)
CO2: 30 mmol/L (ref 20–32)
Calcium: 9.8 mg/dL (ref 8.6–10.3)
Chloride: 104 mmol/L (ref 98–110)
Creat: 1.09 mg/dL (ref 0.70–1.33)
GFR, Est African American: 86 mL/min/{1.73_m2} (ref >=60)
GFR, Est Non African American: 74 mL/min/{1.73_m2} (ref >=60)
Globulin: 2 g/dL (calc) (ref 1.9–3.7)
Glucose, Bld: 125 mg/dL — ABNORMAL HIGH (ref 65–99)
Potassium: 5.1 mmol/L (ref 3.5–5.3)
Sodium: 142 mmol/L (ref 135–146)
Total Bilirubin: 0.6 mg/dL (ref 0.2–1.2)
Total Protein: 6.5 g/dL (ref 6.1–8.1)

## 2017-11-15 LAB — TEST AUTHORIZATION

## 2017-11-15 LAB — HEMOGLOBIN A1C W/OUT EAG: Hgb A1c MFr Bld: 6.2 % of total Hgb — ABNORMAL HIGH (ref <5.7)

## 2017-11-15 LAB — URIC ACID: Uric Acid, Serum: 8 mg/dL (ref 4.0–8.0)

## 2017-11-15 LAB — PSA: PSA: 1.5 ng/mL (ref <=4.0)

## 2017-11-17 ENCOUNTER — Other Ambulatory Visit: Payer: Self-pay | Admitting: Family Medicine

## 2017-11-18 ENCOUNTER — Other Ambulatory Visit: Payer: Self-pay | Admitting: Family Medicine

## 2017-11-18 DIAGNOSIS — I10 Essential (primary) hypertension: Secondary | ICD-10-CM

## 2017-11-18 DIAGNOSIS — R7303 Prediabetes: Secondary | ICD-10-CM

## 2017-11-18 DIAGNOSIS — E7800 Pure hypercholesterolemia, unspecified: Secondary | ICD-10-CM

## 2017-12-04 ENCOUNTER — Other Ambulatory Visit: Payer: Self-pay | Admitting: Family Medicine

## 2017-12-09 ENCOUNTER — Other Ambulatory Visit: Payer: Self-pay | Admitting: Family Medicine

## 2017-12-14 ENCOUNTER — Other Ambulatory Visit: Payer: Self-pay | Admitting: Family Medicine

## 2017-12-14 MED ORDER — AMLODIPINE BESYLATE 5 MG PO TABS: 5.0000 mg | ORAL_TABLET | Freq: Every day | ORAL | 3 refills | Status: DC

## 2019-01-12 ENCOUNTER — Other Ambulatory Visit: Payer: Self-pay | Admitting: Family Medicine

## 2019-02-03 ENCOUNTER — Other Ambulatory Visit: Payer: Self-pay | Admitting: Family Medicine

## 2019-02-14 ENCOUNTER — Other Ambulatory Visit: Payer: Self-pay | Admitting: Family Medicine

## 2019-03-16 ENCOUNTER — Other Ambulatory Visit: Payer: Self-pay | Admitting: Family Medicine

## 2019-04-05 ENCOUNTER — Other Ambulatory Visit: Payer: Self-pay | Admitting: Family Medicine

## 2019-04-07 ENCOUNTER — Other Ambulatory Visit: Payer: Self-pay | Admitting: Family Medicine

## 2019-05-09 ENCOUNTER — Other Ambulatory Visit: Payer: Self-pay | Admitting: Family Medicine

## 2019-05-22 ENCOUNTER — Encounter: Payer: Self-pay | Admitting: Family Medicine

## 2019-05-22 ENCOUNTER — Ambulatory Visit (INDEPENDENT_AMBULATORY_CARE_PROVIDER_SITE_OTHER): Payer: 59 | Admitting: Family Medicine

## 2019-05-22 ENCOUNTER — Other Ambulatory Visit: Payer: Self-pay

## 2019-05-22 VITALS — BP 160/90 | HR 89 | Temp 97.8°F | Resp 16 | Ht 71.0 in | Wt 264.0 lb

## 2019-05-22 DIAGNOSIS — R6889 Other general symptoms and signs: Secondary | ICD-10-CM

## 2019-05-22 DIAGNOSIS — Z0000 Encounter for general adult medical examination without abnormal findings: Secondary | ICD-10-CM

## 2019-05-22 DIAGNOSIS — Z0001 Encounter for general adult medical examination with abnormal findings: Secondary | ICD-10-CM | POA: Diagnosis not present

## 2019-05-22 MED ORDER — AMLODIPINE BESYLATE 10 MG PO TABS: 10.0000 mg | ORAL_TABLET | Freq: Every day | ORAL | 3 refills | Status: DC

## 2019-05-22 NOTE — Progress Notes (Signed)
Subjective:    Patient ID: Andrew Cross, male    DOB: Oct 17, 1955, 64 y.o.   MRN: [id number]  HPI Patient is a very pleasant 64 year old Caucasian male here today for complete physical exam.  He denies any medical concerns.  His blood pressure however is elevated at 160/90.  He is checking at home and finding it to be in the 297L systolic.  He denies any chest pain shortness of breath or dyspnea on exertion.  He denies any symptoms of sleep apnea.  He denies any hypersomnolence.  He denies any snoring.  He denies his wife telling him that he snores loudly at night or he stops breathing.  He is taking his medication and he is avoiding salt as best possible.  He is trying to lose weight.  His last colonoscopy was in 2018 and is up-to-date.  He is due for prostate cancer screening.  He is also due for Covid vaccination as well as the shingles shot.  I recommended both of these.  Otherwise he is doing well with no concerns.  He states that earlier this year he had flulike symptoms and he would like to check his antibody levels to see if he possibly had COVID-19 prior to receiving the vaccine. Past Medical History:  Diagnosis Date  . H/O: gout   . Hypertension   . Prediabetes    Past Surgical History:  Procedure Laterality Date  . FRACTURE SURGERY  1970 / 1980   left forearm / right ankle  . VASECTOMY  1993   Current Outpatient Medications on File Prior to Visit  Medication Sig Dispense Refill  . hydrochlorothiazide (HYDRODIURIL) 25 MG tablet TAKE 1 TABLET BY MOUTH EVERY DAY 30 tablet 0  . ibuprofen (ADVIL,MOTRIN) 200 MG tablet Take 200 mg by mouth as needed.    . metoprolol succinate (TOPROL-XL) 50 MG 24 hr tablet TAKE 1 TABLET (50 MG TOTAL) BY MOUTH DAILY. TAKE WITH OR IMMEDIATELY FOLLOWING A MEAL. 30 tablet 0  . ramipril (ALTACE) 5 MG capsule Take 1 capsule (5 mg total) by mouth 2 (two) times daily. (Needs office visit and labs before further refills) 60 capsule 0   No current  facility-administered medications on file prior to visit.   No Known Allergies Social History   Socioeconomic History  . Marital status: Married    Spouse name: Not on file  . Number of children: Not on file  . Years of education: Not on file  . Highest education level: Not on file  Occupational History  . Not on file  Tobacco Use  . Smoking status: Never Smoker  . Smokeless tobacco: Never Used  Substance and Sexual Activity  . Alcohol use: Yes    Comment: 2 mixed drinks a month  . Drug use: No  . Sexual activity: Not Currently    Birth control/protection: Surgical  Other Topics Concern  . Not on file  Social History Narrative  . Not on file   Social Determinants of Health   Financial Resource Strain:   . Difficulty of Paying Living Expenses:   Food Insecurity:   . Worried About Charity fundraiser in the Last Year:   . Arboriculturist in the Last Year:   Transportation Needs:   . Film/video editor (Medical):   Marland Kitchen Lack of Transportation (Non-Medical):   Physical Activity:   . Days of Exercise per Week:   . Minutes of Exercise per Session:   Stress:   . Feeling  of Stress :   Social Connections:   . Frequency of Communication with Friends and Family:   . Frequency of Social Gatherings with Friends and Family:   . Attends Religious Services:   . Active Member of Clubs or Organizations:   . Attends Banker Meetings:   Marland Kitchen Marital Status:   Intimate Partner Violence:   . Fear of Current or Ex-Partner:   . Emotionally Abused:   Marland Kitchen Physically Abused:   . Sexually Abused:    Family History  Problem Relation Age of Onset  . Hypertension Mother   . Hypertension Father   . Hypertension Sister   . Arthritis Maternal Grandmother   . Hypertension Maternal Grandmother   . Hypertension Maternal Grandfather   . Hypertension Paternal Grandmother   . Alcohol abuse Paternal Grandfather   . Hypertension Paternal Grandfather       Review of Systems       Objective:   Physical Exam  Constitutional: He is oriented to person, place, and time. He appears well-developed and well-nourished. No distress.  HENT:  Head: Normocephalic and atraumatic.  Right Ear: External ear normal.  Left Ear: External ear normal.  Nose: Nose normal.  Mouth/Throat: Oropharynx is clear and moist. No oropharyngeal exudate.  Eyes: Pupils are equal, round, and reactive to light. Conjunctivae and EOM are normal. Right eye exhibits no discharge. Left eye exhibits no discharge. No scleral icterus.  Neck: No JVD present. No tracheal deviation present. No thyromegaly present.  Cardiovascular: Normal rate, regular rhythm, normal heart sounds and intact distal pulses. Exam reveals no gallop and no friction rub.  No murmur heard. Pulmonary/Chest: Effort normal and breath sounds normal. No stridor. No respiratory distress. He has no wheezes. He has no rales. He exhibits no tenderness.  Abdominal: Soft. Bowel sounds are normal. He exhibits no distension and no mass. There is no abdominal tenderness. There is no rebound and no guarding.  Musculoskeletal:        General: No tenderness or edema. Normal range of motion.     Cervical back: Normal range of motion and neck supple.  Lymphadenopathy:    He has no cervical adenopathy.  Neurological: He is alert and oriented to person, place, and time. He has normal reflexes. No cranial nerve deficit. He exhibits normal muscle tone. Coordination normal.  Skin: Skin is warm. No rash noted. He is not diaphoretic. No erythema. No pallor.  Psychiatric: He has a normal mood and affect. His behavior is normal. Judgment and thought content normal.  Vitals reviewed.         Assessment & Plan:  General medical exam - Plan: CBC with Differential/Platelet, COMPLETE METABOLIC PANEL WITH GFR, Lipid panel, PSA  Flu-like symptoms - Plan: SARS CoV2 Serology(COVID19) AB(IgG,IgM),Immunoassay  Physical exam today is normal except for his elevated  blood pressure and his elevated BMI.  I continue to encourage diet exercise and weight loss.  I would increase his amlodipine to 10 mg a day and recheck blood pressure via telephone in 1 month.  Check CBC, CMP, and fasting lipid panel.  Goal LDL cholesterol is less than 100.  Screen for prostate cancer with cold PSA.  Per the patient's request I will check IgG and IgM levels for Covid immunity.

## 2019-05-25 ENCOUNTER — Encounter: Payer: Self-pay | Admitting: Family Medicine

## 2019-05-25 DIAGNOSIS — E118 Type 2 diabetes mellitus with unspecified complications: Secondary | ICD-10-CM | POA: Insufficient documentation

## 2019-05-25 DIAGNOSIS — E785 Hyperlipidemia, unspecified: Secondary | ICD-10-CM | POA: Insufficient documentation

## 2019-05-25 LAB — COMPLETE METABOLIC PANEL WITH GFR
AG Ratio: 2 (calc) (ref 1.0–2.5)
ALT: 33 U/L (ref 9–46)
AST: 27 U/L (ref 10–35)
Albumin: 4.3 g/dL (ref 3.6–5.1)
Alkaline phosphatase (APISO): 96 U/L (ref 35–144)
BUN: 16 mg/dL (ref 7–25)
CO2: 28 mmol/L (ref 20–32)
Calcium: 9.5 mg/dL (ref 8.6–10.3)
Chloride: 104 mmol/L (ref 98–110)
Creat: 1.1 mg/dL (ref 0.70–1.25)
GFR, Est African American: 84 mL/min/{1.73_m2} (ref >=60)
GFR, Est Non African American: 72 mL/min/{1.73_m2} (ref >=60)
Globulin: 2.1 g/dL (calc) (ref 1.9–3.7)
Glucose, Bld: 164 mg/dL — ABNORMAL HIGH (ref 65–99)
Potassium: 4.2 mmol/L (ref 3.5–5.3)
Sodium: 141 mmol/L (ref 135–146)
Total Bilirubin: 0.8 mg/dL (ref 0.2–1.2)
Total Protein: 6.4 g/dL (ref 6.1–8.1)

## 2019-05-25 LAB — TEST AUTHORIZATION

## 2019-05-25 LAB — LIPID PANEL
Cholesterol: 186 mg/dL (ref <200)
HDL: 45 mg/dL (ref >=40)
LDL Cholesterol (Calc): 118 mg/dL (calc) — ABNORMAL HIGH
Non-HDL Cholesterol (Calc): 141 mg/dL (calc) — ABNORMAL HIGH (ref <130)
Total CHOL/HDL Ratio: 4.1 (calc) (ref <5.0)
Triglycerides: 122 mg/dL (ref <150)

## 2019-05-25 LAB — PSA: PSA: 1.3 ng/mL (ref <=4.0)

## 2019-05-25 LAB — CBC WITH DIFFERENTIAL/PLATELET
Absolute Monocytes: 612 cells/uL (ref 200–950)
Basophils Absolute: 58 cells/uL (ref 0–200)
Basophils Relative: 0.8 %
Eosinophils Absolute: 324 cells/uL (ref 15–500)
Eosinophils Relative: 4.5 %
HCT: 49.3 % (ref 38.5–50.0)
Hemoglobin: 17.2 g/dL — ABNORMAL HIGH (ref 13.2–17.1)
Lymphs Abs: 1951 cells/uL (ref 850–3900)
MCH: 34 pg — ABNORMAL HIGH (ref 27.0–33.0)
MCHC: 34.9 g/dL (ref 32.0–36.0)
MCV: 97.4 fL (ref 80.0–100.0)
MPV: 10.6 fL (ref 7.5–12.5)
Monocytes Relative: 8.5 %
Neutro Abs: 4255 cells/uL (ref 1500–7800)
Neutrophils Relative %: 59.1 %
Platelets: 209 10*3/uL (ref 140–400)
RBC: 5.06 10*6/uL (ref 4.20–5.80)
RDW: 12.1 % (ref 11.0–15.0)
Total Lymphocyte: 27.1 %
WBC: 7.2 10*3/uL (ref 3.8–10.8)

## 2019-05-25 LAB — SARS COV-2 SEROLOGY(COVID-19)AB(IGG,IGM),IMMUNOASSAY
SARS CoV-2 AB IgG: NEGATIVE
SARS CoV-2 IgM: NEGATIVE

## 2019-05-25 LAB — HEMOGLOBIN A1C W/OUT EAG: Hgb A1c MFr Bld: 6.8 % of total Hgb — ABNORMAL HIGH (ref <5.7)

## 2019-05-30 ENCOUNTER — Other Ambulatory Visit: Payer: Self-pay | Admitting: Family Medicine

## 2019-05-30 MED ORDER — METFORMIN HCL 500 MG PO TABS: 500.0000 mg | ORAL_TABLET | Freq: Two times a day (BID) | ORAL | 1 refills | Status: DC

## 2019-05-30 MED ORDER — ROSUVASTATIN CALCIUM 10 MG PO TABS: 10.0000 mg | ORAL_TABLET | Freq: Every day | ORAL | 1 refills | Status: DC

## 2019-06-03 ENCOUNTER — Other Ambulatory Visit: Payer: Self-pay | Admitting: Family Medicine

## 2019-07-12 ENCOUNTER — Other Ambulatory Visit: Payer: Self-pay | Admitting: Family Medicine

## 2019-07-12 MED ORDER — AMLODIPINE BESYLATE 10 MG PO TABS: 10.0000 mg | ORAL_TABLET | Freq: Every day | ORAL | 3 refills | Status: DC

## 2019-08-01 ENCOUNTER — Other Ambulatory Visit: Payer: Self-pay | Admitting: Family Medicine

## 2019-08-07 ENCOUNTER — Other Ambulatory Visit: Payer: Self-pay

## 2019-08-07 ENCOUNTER — Ambulatory Visit (INDEPENDENT_AMBULATORY_CARE_PROVIDER_SITE_OTHER): Payer: 59 | Admitting: Family Medicine

## 2019-08-07 VITALS — BP 170/98 | HR 82 | Temp 97.0°F | Wt 260.0 lb

## 2019-08-07 DIAGNOSIS — E7800 Pure hypercholesterolemia, unspecified: Secondary | ICD-10-CM

## 2019-08-07 DIAGNOSIS — I10 Essential (primary) hypertension: Secondary | ICD-10-CM | POA: Diagnosis not present

## 2019-08-07 DIAGNOSIS — E118 Type 2 diabetes mellitus with unspecified complications: Secondary | ICD-10-CM | POA: Diagnosis not present

## 2019-08-07 NOTE — Progress Notes (Signed)
Subjective:    Patient ID: Andrew Cross, male    DOB: Dec 20, 1955, 64 y.o.   MRN: [id number]  HPI Patient's blood pressure remains elevated.  It is 170/98 today.  He admits that he has not taken his blood pressure medication in the last 3 days.  He is on ramipril, amlodipine, and hydrochlorothiazide.  He has not taken any of his medication in 3 days.  He states that he forgets to take it.  He is also been haphazard taking his metformin.  At his last office visit, his A1c was 6.8.  He denies any hypoglycemic episodes.  He does report some diarrhea on the Metformin.  I started him on 500 mg twice daily.  He states that he takes this about 50% of the time.  He is taking his cholesterol medication but also again taking it approximately 50% of the time.  He denies any myalgias or right upper quadrant pain. Past Medical History:  Diagnosis Date  . Diabetes mellitus type 2 with complications (HCC)   . H/O: gout   . HLD (hyperlipidemia)   . Hypertension    Past Surgical History:  Procedure Laterality Date  . FRACTURE SURGERY  1970 / 1980   left forearm / right ankle  . VASECTOMY  1993   Current Outpatient Medications on File Prior to Visit  Medication Sig Dispense Refill  . amLODipine (NORVASC) 10 MG tablet Take 1 tablet (10 mg total) by mouth daily. 90 tablet 3  . amLODipine (NORVASC) 5 MG tablet TAKE 1 TABLET BY MOUTH EVERY DAY 30 tablet 2  . hydrochlorothiazide (HYDRODIURIL) 25 MG tablet TAKE 1 TABLET BY MOUTH EVERY DAY 90 tablet 3  . ibuprofen (ADVIL,MOTRIN) 200 MG tablet Take 200 mg by mouth as needed.    . metFORMIN (GLUCOPHAGE) 500 MG tablet Take 1 tablet (500 mg total) by mouth 2 (two) times daily with a meal. 180 tablet 1  . metoprolol succinate (TOPROL-XL) 50 MG 24 hr tablet TAKE 1 TABLET (50 MG TOTAL) BY MOUTH DAILY. TAKE WITH OR IMMEDIATELY FOLLOWING A MEAL. 90 tablet 3  . ramipril (ALTACE) 5 MG capsule Take 1 capsule (5 mg total) by mouth 2 (two) times daily. (Needs office visit  and labs before further refills) 60 capsule 0  . rosuvastatin (CRESTOR) 10 MG tablet Take 1 tablet (10 mg total) by mouth daily. 90 tablet 1   No current facility-administered medications on file prior to visit.    No Known Allergies Social History   Socioeconomic History  . Marital status: Married    Spouse name: Not on file  . Number of children: Not on file  . Years of education: Not on file  . Highest education level: Not on file  Occupational History  . Not on file  Tobacco Use  . Smoking status: Never Smoker  . Smokeless tobacco: Never Used  Substance and Sexual Activity  . Alcohol use: Yes    Comment: 2 mixed drinks a month  . Drug use: No  . Sexual activity: Not Currently    Birth control/protection: Surgical  Other Topics Concern  . Not on file  Social History Narrative  . Not on file   Social Determinants of Health   Financial Resource Strain:   . Difficulty of Paying Living Expenses:   Food Insecurity:   . Worried About Programme researcher, broadcasting/film/video in the Last Year:   . Barista in the Last Year:   Transportation Needs:   .  Lack of Transportation (Medical):   Marland Kitchen Lack of Transportation (Non-Medical):   Physical Activity:   . Days of Exercise per Week:   . Minutes of Exercise per Session:   Stress:   . Feeling of Stress :   Social Connections:   . Frequency of Communication with Friends and Family:   . Frequency of Social Gatherings with Friends and Family:   . Attends Religious Services:   . Active Member of Clubs or Organizations:   . Attends Banker Meetings:   Marland Kitchen Marital Status:   Intimate Partner Violence:   . Fear of Current or Ex-Partner:   . Emotionally Abused:   Marland Kitchen Physically Abused:   . Sexually Abused:    Family History  Problem Relation Age of Onset  . Hypertension Mother   . Hypertension Father   . Hypertension Sister   . Arthritis Maternal Grandmother   . Hypertension Maternal Grandmother   . Hypertension Maternal  Grandfather   . Hypertension Paternal Grandmother   . Alcohol abuse Paternal Grandfather   . Hypertension Paternal Grandfather       Review of Systems     Objective:   Physical Exam Vitals reviewed.  Constitutional:      General: He is not in acute distress.    Appearance: He is well-developed. He is not diaphoretic.  HENT:     Head: Normocephalic and atraumatic.     Right Ear: External ear normal.     Left Ear: External ear normal.     Nose: Nose normal.     Mouth/Throat:     Pharynx: No oropharyngeal exudate.  Eyes:     General: No scleral icterus.       Right eye: No discharge.        Left eye: No discharge.     Conjunctiva/sclera: Conjunctivae normal.     Pupils: Pupils are equal, round, and reactive to light.  Neck:     Thyroid: No thyromegaly.     Vascular: No JVD.     Trachea: No tracheal deviation.  Cardiovascular:     Rate and Rhythm: Normal rate and regular rhythm.     Heart sounds: Normal heart sounds. No murmur heard.  No friction rub. No gallop.   Pulmonary:     Effort: Pulmonary effort is normal. No respiratory distress.     Breath sounds: Normal breath sounds. No stridor. No wheezing or rales.  Chest:     Chest wall: No tenderness.  Abdominal:     General: Bowel sounds are normal. There is no distension.     Palpations: Abdomen is soft. There is no mass.     Tenderness: There is no abdominal tenderness. There is no guarding or rebound.  Musculoskeletal:        General: No tenderness. Normal range of motion.     Cervical back: Normal range of motion and neck supple.  Lymphadenopathy:     Cervical: No cervical adenopathy.  Skin:    General: Skin is warm.     Coloration: Skin is not pale.     Findings: No erythema or rash.  Neurological:     Mental Status: He is alert and oriented to person, place, and time.     Cranial Nerves: No cranial nerve deficit.     Motor: No abnormal muscle tone.     Coordination: Coordination normal.     Deep Tendon  Reflexes: Reflexes are normal and symmetric.  Psychiatric:  Behavior: Behavior normal.        Thought Content: Thought content normal.        Judgment: Judgment normal.           Assessment & Plan:  Diabetes mellitus type 2 with complications (HCC) - Plan: Hemoglobin A1c, Microalbumin, urine, CBC with Differential/Platelet, COMPLETE METABOLIC PANEL WITH GFR, Lipid panel  Elevated cholesterol  Benign essential HTN Strongly encouraged the patient to be consistent in taking his blood pressure medication.  I explained that his blood pressure today puts him at 4-6 times the risk of heart attack over the next 10 years.  I believe that we can control this if he took his medication regularly.  I would like him to take it daily and call me with the values in 1 to 2 weeks so we can make sure that his systolic blood pressures less than 140 and his diastolic blood pressures less than 90.  I will also check an A1c.  I encourage compliance with his Metformin.  Also recommended diet exercise and weight loss.  I recommended less than 45 g of carbohydrates per meal and we discussed a low carbohydrate diet.  I will also check a fasting lipid panel.  Given his diabetes, his goal LDL cholesterol is less than 70.  Diabetic foot exam is normal.  Spent 30 minutes today with the patient encouraging compliance and discussing diabetic lifestyle changes.

## 2019-08-08 LAB — CBC WITH DIFFERENTIAL/PLATELET
Absolute Monocytes: 491 cells/uL (ref 200–950)
Basophils Absolute: 50 cells/uL (ref 0–200)
Basophils Relative: 0.8 %
Eosinophils Absolute: 328 cells/uL (ref 15–500)
Eosinophils Relative: 5.2 %
HCT: 47.4 % (ref 38.5–50.0)
Hemoglobin: 16.7 g/dL (ref 13.2–17.1)
Lymphs Abs: 1978 cells/uL (ref 850–3900)
MCH: 34 pg — ABNORMAL HIGH (ref 27.0–33.0)
MCHC: 35.2 g/dL (ref 32.0–36.0)
MCV: 96.5 fL (ref 80.0–100.0)
MPV: 10.8 fL (ref 7.5–12.5)
Monocytes Relative: 7.8 %
Neutro Abs: 3452 cells/uL (ref 1500–7800)
Neutrophils Relative %: 54.8 %
Platelets: 185 10*3/uL (ref 140–400)
RBC: 4.91 10*6/uL (ref 4.20–5.80)
RDW: 11.9 % (ref 11.0–15.0)
Total Lymphocyte: 31.4 %
WBC: 6.3 10*3/uL (ref 3.8–10.8)

## 2019-08-08 LAB — LIPID PANEL
Cholesterol: 186 mg/dL (ref <200)
HDL: 49 mg/dL (ref >=40)
LDL Cholesterol (Calc): 117 mg/dL (calc) — ABNORMAL HIGH
Non-HDL Cholesterol (Calc): 137 mg/dL (calc) — ABNORMAL HIGH (ref <130)
Total CHOL/HDL Ratio: 3.8 (calc) (ref <5.0)
Triglycerides: 95 mg/dL (ref <150)

## 2019-08-08 LAB — COMPLETE METABOLIC PANEL WITH GFR
AG Ratio: 2.1 (calc) (ref 1.0–2.5)
ALT: 31 U/L (ref 9–46)
AST: 26 U/L (ref 10–35)
Albumin: 4.4 g/dL (ref 3.6–5.1)
Alkaline phosphatase (APISO): 88 U/L (ref 35–144)
BUN: 17 mg/dL (ref 7–25)
CO2: 27 mmol/L (ref 20–32)
Calcium: 9.6 mg/dL (ref 8.6–10.3)
Chloride: 104 mmol/L (ref 98–110)
Creat: 1.21 mg/dL (ref 0.70–1.25)
GFR, Est African American: 74 mL/min/{1.73_m2} (ref >=60)
GFR, Est Non African American: 64 mL/min/{1.73_m2} (ref >=60)
Globulin: 2.1 g/dL (calc) (ref 1.9–3.7)
Glucose, Bld: 163 mg/dL — ABNORMAL HIGH (ref 65–99)
Potassium: 4.8 mmol/L (ref 3.5–5.3)
Sodium: 140 mmol/L (ref 135–146)
Total Bilirubin: 0.9 mg/dL (ref 0.2–1.2)
Total Protein: 6.5 g/dL (ref 6.1–8.1)

## 2019-08-08 LAB — HEMOGLOBIN A1C
Hgb A1c MFr Bld: 6.9 % of total Hgb — ABNORMAL HIGH (ref <5.7)
Mean Plasma Glucose: 151 (calc)
eAG (mmol/L): 8.4 (calc)

## 2019-08-08 LAB — MICROALBUMIN, URINE: Microalb, Ur: 0.6 mg/dL

## 2019-09-05 ENCOUNTER — Other Ambulatory Visit: Payer: Self-pay

## 2019-09-05 MED ORDER — RAMIPRIL 5 MG PO CAPS: 5.0000 mg | ORAL_CAPSULE | Freq: Two times a day (BID) | ORAL | 2 refills | Status: DC

## 2019-09-12 ENCOUNTER — Other Ambulatory Visit: Payer: Self-pay

## 2019-10-30 ENCOUNTER — Other Ambulatory Visit: Payer: Self-pay | Admitting: Family Medicine

## 2019-11-06 ENCOUNTER — Other Ambulatory Visit: Payer: Self-pay

## 2019-11-06 MED ORDER — ROSUVASTATIN CALCIUM 10 MG PO TABS: 10.0000 mg | ORAL_TABLET | Freq: Every day | ORAL | 1 refills | Status: DC

## 2019-12-06 ENCOUNTER — Other Ambulatory Visit: Payer: Self-pay | Admitting: Family Medicine

## 2020-01-04 ENCOUNTER — Other Ambulatory Visit: Payer: Self-pay | Admitting: Family Medicine

## 2020-01-28 ENCOUNTER — Other Ambulatory Visit: Payer: Self-pay

## 2020-01-28 ENCOUNTER — Emergency Department: Payer: 59

## 2020-01-28 ENCOUNTER — Telehealth: Payer: Self-pay

## 2020-01-28 ENCOUNTER — Other Ambulatory Visit
Admission: RE | Admit: 2020-01-28 | Discharge: 2020-01-28 | Disposition: A | Discharge status: Home / Self Care | Payer: 59 | Source: Ambulatory Visit | Attending: Family Medicine | Admitting: Family Medicine

## 2020-01-28 ENCOUNTER — Emergency Department
Admission: EM | Admit: 2020-01-28 | Discharge: 2020-01-29 | Disposition: A | Discharge status: Home / Self Care | Payer: 59 | Attending: Emergency Medicine | Admitting: Emergency Medicine

## 2020-01-28 DIAGNOSIS — J069 Acute upper respiratory infection, unspecified: Secondary | ICD-10-CM | POA: Insufficient documentation

## 2020-01-28 DIAGNOSIS — R059 Cough, unspecified: Secondary | ICD-10-CM | POA: Insufficient documentation

## 2020-01-28 DIAGNOSIS — R000 Tachycardia, unspecified: Secondary | ICD-10-CM | POA: Insufficient documentation

## 2020-01-28 DIAGNOSIS — E119 Type 2 diabetes mellitus without complications: Secondary | ICD-10-CM | POA: Diagnosis not present

## 2020-01-28 DIAGNOSIS — Z79899 Other long term (current) drug therapy: Secondary | ICD-10-CM | POA: Insufficient documentation

## 2020-01-28 DIAGNOSIS — Z7984 Long term (current) use of oral hypoglycemic drugs: Secondary | ICD-10-CM | POA: Diagnosis not present

## 2020-01-28 DIAGNOSIS — U071 COVID-19: Secondary | ICD-10-CM | POA: Insufficient documentation

## 2020-01-28 DIAGNOSIS — M791 Myalgia, unspecified site: Secondary | ICD-10-CM | POA: Diagnosis present

## 2020-01-28 DIAGNOSIS — J40 Bronchitis, not specified as acute or chronic: Secondary | ICD-10-CM | POA: Insufficient documentation

## 2020-01-28 DIAGNOSIS — I10 Essential (primary) hypertension: Secondary | ICD-10-CM | POA: Insufficient documentation

## 2020-01-28 DIAGNOSIS — J22 Unspecified acute lower respiratory infection: Secondary | ICD-10-CM

## 2020-01-28 LAB — COMPREHENSIVE METABOLIC PANEL
ALT: 43 U/L (ref 0–44)
AST: 64 U/L — ABNORMAL HIGH (ref 15–41)
Albumin: 3.8 g/dL (ref 3.5–5.0)
Alkaline Phosphatase: 54 U/L (ref 38–126)
Anion gap: 13 (ref 5–15)
BUN: 16 mg/dL (ref 8–23)
CO2: 22 mmol/L (ref 22–32)
Calcium: 8.5 mg/dL — ABNORMAL LOW (ref 8.9–10.3)
Chloride: 95 mmol/L — ABNORMAL LOW (ref 98–111)
Creatinine, Ser: 1.11 mg/dL (ref 0.61–1.24)
GFR, Estimated: >60 mL/min (ref >60)
Glucose, Bld: 200 mg/dL — ABNORMAL HIGH (ref 70–99)
Potassium: 4.1 mmol/L (ref 3.5–5.1)
Sodium: 130 mmol/L — ABNORMAL LOW (ref 135–145)
Total Bilirubin: 1 mg/dL (ref 0.3–1.2)
Total Protein: 6.7 g/dL (ref 6.5–8.1)

## 2020-01-28 LAB — CBC WITH DIFFERENTIAL/PLATELET
Abs Immature Granulocytes: 0.03 10*3/uL (ref 0.00–0.07)
Basophils Absolute: 0 10*3/uL (ref 0.0–0.1)
Basophils Relative: 0 %
Eosinophils Absolute: 0 10*3/uL (ref 0.0–0.5)
Eosinophils Relative: 0 %
HCT: 44 % (ref 39.0–52.0)
Hemoglobin: 16 g/dL (ref 13.0–17.0)
Immature Granulocytes: 1 %
Lymphocytes Relative: 16 %
Lymphs Abs: 1 10*3/uL (ref 0.7–4.0)
MCH: 32.7 pg (ref 26.0–34.0)
MCHC: 36.4 g/dL — ABNORMAL HIGH (ref 30.0–36.0)
MCV: 89.8 fL (ref 80.0–100.0)
Monocytes Absolute: 0.4 10*3/uL (ref 0.1–1.0)
Monocytes Relative: 7 %
Neutro Abs: 5 10*3/uL (ref 1.7–7.7)
Neutrophils Relative %: 76 %
Platelets: 150 10*3/uL (ref 150–400)
RBC: 4.9 MIL/uL (ref 4.22–5.81)
RDW: 11.1 % — ABNORMAL LOW (ref 11.5–15.5)
WBC: 6.4 10*3/uL (ref 4.0–10.5)
nRBC: 0 % (ref 0.0–0.2)

## 2020-01-28 LAB — FIBRIN DERIVATIVES D-DIMER (ARMC ONLY): Fibrin derivatives D-dimer (ARMC): 1312.27 ng/mL (FEU) — ABNORMAL HIGH (ref 0.00–499.00)

## 2020-01-28 MED ORDER — ACETAMINOPHEN 325 MG PO TABS
ORAL_TABLET | ORAL | Status: AC
  Administered 2020-01-28: 650 mg via ORAL
  Filled 2020-01-28: qty 2

## 2020-01-28 MED ORDER — ACETAMINOPHEN 325 MG PO TABS: 650.0000 mg | ORAL_TABLET | Freq: Once | ORAL | Status: AC | PRN

## 2020-01-28 MED ORDER — IOHEXOL 350 MG/ML SOLN
100.0000 mL | Freq: Once | INTRAVENOUS | Status: AC | PRN
  Administered 2020-01-28: 100 mL via INTRAVENOUS

## 2020-01-28 NOTE — ED Triage Notes (Signed)
PT to ED via POV c/o abnormal lab. PT was seen at [facility] today for fatigue and weakness d/t covid.  Ddimer was around 1300.

## 2020-01-28 NOTE — Telephone Encounter (Signed)
Called patient back he's on his way to another clinic, to be seen after testing positive for covid

## 2020-01-28 NOTE — ED Notes (Signed)
Patient comes in after being sent by Dr for an elevated D-dimer. Patient is AOx4 and has no complaints at this time.

## 2020-01-28 NOTE — ED Provider Notes (Signed)
[facility] Emergency Department Provider Note  ____________________________________________   Event Date/Time   First MD Initiated Contact with Patient 01/28/20 2313     (approximate)  I have reviewed the triage vital signs and the nursing notes.   HISTORY  Chief Complaint No chief complaint on file.    HPI Andrew Cross is a 64 y.o. male with medical history as listed below who was diagnosed with COVID-19 about 5 days ago and presents for worsening symptoms.  He said that he continues to feel bad all over with some body aches and intermittent fever/chills.  He has had loss of smell and taste but no sore throat.  He said that he has had shortness of breath that has been getting worse and is associated with exertion.  He feels a little bit better with rest.  He is able to eat and drink and has had no nausea, vomiting, nor abdominal pain.  He went to the urgent care today and had some lab work done and he was sent to the ED for further evaluation because of an elevated D-dimer.  He is currently not having any chest pain, he just has some pain when he coughs.  He reports not being vaccinated for COVID-19 and having multiple recent ill contacts.        Past Medical History:  Diagnosis Date  . Diabetes mellitus type 2 with complications (HCC)   . H/O: gout   . HLD (hyperlipidemia)   . Hypertension     Patient Active Problem List   Diagnosis Date Noted  . Diabetes mellitus type 2 with complications (HCC)   . HLD (hyperlipidemia)   . Hypertension   . H/O: gout     Past Surgical History:  Procedure Laterality Date  . FRACTURE SURGERY  1970 / 1980   left forearm / right ankle  . VASECTOMY  1993    Prior to Admission medications   Medication Sig Start Date End Date Taking? Authorizing Provider  albuterol (VENTOLIN HFA) 108 (90 Base) MCG/ACT inhaler Inhale 2-4 puffs by mouth every 4 hours as needed for wheezing, cough, and/or shortness of  breath 01/29/20  Yes Loleta Rose, MD  chlorpheniramine-HYDROcodone [facility] PENNKINETIC ER) 10-8 MG/5ML SUER Take 5 mLs by mouth every 12 (twelve) hours as needed for cough. 01/29/20  Yes Loleta Rose, MD  predniSONE (DELTASONE) 10 MG tablet Take 6 tabs (60 mg) PO x 3 days, then take 4 tabs (40 mg) PO x 3 days, then take 2 tabs (20 mg) PO x 3 days, then take 1 tab (10 mg) PO x 3 days, then take 1/2 tab (5 mg) PO x 4 days. 01/29/20  Yes Loleta Rose, MD  amLODipine (NORVASC) 10 MG tablet Take 1 tablet (10 mg total) by mouth daily. 07/12/19   Donita Brooks, MD  amLODipine (NORVASC) 5 MG tablet TAKE 1 TABLET BY MOUTH EVERY DAY 10/31/19   Donita Brooks, MD  hydrochlorothiazide (HYDRODIURIL) 25 MG tablet TAKE 1 TABLET BY MOUTH EVERY DAY 06/04/19   Donita Brooks, MD  ibuprofen (ADVIL,MOTRIN) 200 MG tablet Take 200 mg by mouth as needed.    [provider]  metFORMIN (GLUCOPHAGE) 500 MG tablet TAKE 1 TABLET (500 MG TOTAL) BY MOUTH 2 (TWO) TIMES DAILY WITH A MEAL. 01/04/20   Donita Brooks, MD  metoprolol succinate (TOPROL-XL) 50 MG 24 hr tablet TAKE 1 TABLET (50 MG TOTAL) BY MOUTH DAILY. TAKE WITH OR IMMEDIATELY FOLLOWING A MEAL. 06/04/19  Donita Brooks, MD  ramipril (ALTACE) 5 MG capsule TAKE 1 CAPSULE (5 MG TOTAL) BY MOUTH 2 (TWO) TIMES DAILY. 12/06/19   Donita Brooks, MD  rosuvastatin (CRESTOR) 10 MG tablet Take 1 tablet (10 mg total) by mouth daily. 11/06/19   Donita Brooks, MD    Allergies Patient has no known allergies.  Family History  Problem Relation Age of Onset  . Hypertension Mother   . Hypertension Father   . Hypertension Sister   . Arthritis Maternal Grandmother   . Hypertension Maternal Grandmother   . Hypertension Maternal Grandfather   . Hypertension Paternal Grandmother   . Alcohol abuse Paternal Grandfather   . Hypertension Paternal Grandfather     Social History Social History   Tobacco Use  . Smoking status: Never Smoker  . Smokeless  tobacco: Never Used  Substance Use Topics  . Alcohol use: Not Currently    Comment: 2 mixed drinks a month  . Drug use: No    Review of Systems Constitutional: +fever/chills.  Positive for malaise and fatigue. Eyes: No visual changes. ENT: No sore throat. + Loss of smell and taste. Cardiovascular: Some pain when he coughs. Respiratory: Dyspnea on exertion with cough. Gastrointestinal: No abdominal pain.  No nausea, no vomiting.  No diarrhea.  No constipation. Genitourinary: Negative for dysuria. Musculoskeletal: Generalized body aches.  Negative for neck pain.  Negative for back pain. Integumentary: Negative for rash. Neurological: Negative for headaches, focal weakness or numbness.   ____________________________________________   PHYSICAL EXAM:  VITAL SIGNS: ED Triage Vitals  Enc Vitals Group     BP 01/28/20 1942 128/85     Pulse Rate 01/28/20 1942 (!) 113     Resp 01/28/20 1942 20     Temp 01/28/20 1942 (!) 100.5 F (38.1 C)     Temp Source 01/28/20 1942 Oral     SpO2 01/28/20 1942 90 %     Weight 01/28/20 1943 116.1 kg (256 lb)     Height 01/28/20 1943 1.803 m (5\' 11" )     Head Circumference --      Peak Flow --      Pain Score 01/28/20 1942 0     Pain Loc --      Pain Edu? --      Excl. in GC? --     Constitutional: Alert and oriented.  Eyes: Conjunctivae are normal.  Head: Atraumatic. Nose: No congestion/rhinnorhea. Mouth/Throat: Patient is wearing a mask. Neck: No stridor.  No meningeal signs.   Cardiovascular: Normal rate, regular rhythm. Good peripheral circulation. Respiratory: Normal respiratory effort.  No retractions.  Patient is able to speak in full sentences without difficulty.  Oxygen saturation about 91 to 92% at rest. Gastrointestinal: Soft and nontender. No distention.  Musculoskeletal: No lower extremity tenderness nor edema. No gross deformities of extremities. Neurologic:  Normal speech and language. No gross focal neurologic deficits are  appreciated.  Skin:  Skin is warm, dry and intact. Psychiatric: Mood and affect are normal. Speech and behavior are normal.  ____________________________________________   LABS (all labs ordered are listed, but only abnormal results are displayed)  Labs Reviewed  CBC WITH DIFFERENTIAL/PLATELET - Abnormal; Notable for the following components:      Result Value   MCHC 36.4 (*)    RDW 11.1 (*)    All other components within normal limits  COMPREHENSIVE METABOLIC PANEL - Abnormal; Notable for the following components:   Sodium 130 (*)    Chloride 95 (*)  Glucose, Bld 200 (*)    Calcium 8.5 (*)    AST 64 (*)    All other components within normal limits   ____________________________________________  EKG  ED ECG REPORT I, Loleta Roseory Latoia Eyster, the attending physician, personally viewed and interpreted this ECG.  Date: 01/28/2020 EKG Time: 19: 38 Rate: 112 Rhythm: Sinus tachycardia QRS Axis: normal Intervals: normal ST/T Wave abnormalities: normal Narrative Interpretation: no evidence of acute ischemia ____________________________________________  RADIOLOGY I, Loleta Roseory Earlyne Feeser, personally viewed and evaluated these images (plain radiographs) as part of my medical decision making, as well as reviewing the written report by the radiologist.  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  Official radiology report(s): CT Angio Chest PE W and/or Wo Contrast  Result Date: 01/28/2020 CLINICAL DATA:  Fatigue COVID elevated D-dimer EXAM: CT ANGIOGRAPHY CHEST WITH CONTRAST TECHNIQUE: Multidetector CT imaging of the chest was performed using the standard protocol during bolus administration of intravenous contrast. Multiplanar CT image reconstructions and MIPs were obtained to evaluate the vascular anatomy. CONTRAST:  100mL OMNIPAQUE IOHEXOL 350 MG/ML SOLN COMPARISON:  None. FINDINGS: Cardiovascular: Satisfactory opacification of the  pulmonary arteries to the segmental level. No evidence of pulmonary embolism. Nonaneurysmal aorta. No dissection is seen. Cardiac size within normal limits. Mild coronary calcification. No pericardial effusion Mediastinum/Nodes: Midline trachea. No thyroid mass. Esophagus within normal limits. Prominent right paratracheal lymph node measuring 17 mm but with prominent fatty hilus. Lungs/Pleura: Fairly widespread bilateral ground-glass densities and patchy consolidations. No pleural effusion or pneumothorax Upper Abdomen: Hepatic steatosis.  No acute abnormality Musculoskeletal: No chest wall abnormality. No acute or significant osseous findings. Review of the MIP images confirms the above findings. IMPRESSION: 1. Negative for acute pulmonary embolus or aortic dissection. 2. Fairly widespread bilateral ground-glass densities and patchy consolidations, felt consistent with multifocal pneumonia and history of COVID positivity. 3. Hepatic steatosis. Electronically Signed   By: Jasmine PangKim  Fujinaga M.D.   On: 01/28/2020 20:45    ____________________________________________   PROCEDURES   Procedure(s) performed (including Critical Care):  Procedures   ____________________________________________   INITIAL IMPRESSION / MDM / ASSESSMENT AND PLAN / ED COURSE  As part of my medical decision making, I reviewed the following data within the electronic MEDICAL RECORD NUMBER Nursing notes reviewed and incorporated, Labs reviewed , EKG interpreted , Old chart reviewed and Notes from prior ED visits   Differential diagnosis includes, but is not limited to, COVID-19 pneumonia, PE, aortic dissection, ACS.  Patient is hemodynamically stable.  He is actually quite well-appearing in spite of his COVID-19 illness.  His oxygen saturation is on the low side and I would not be surprised if he became hypoxemic with exertion.  His CBC and comprehensive metabolic panel are essentially normal other than some mild hyponatremia.  I  talked with the patient about his CT scan results.  He has extensive COVID-19 lung disease but no evidence of pulmonary embolism.  I offered that we could ambulate him to see if his oxygen saturation drops into the 80s and admitted to the hospital if it does, but understandably he does not want to stay in the hospital I feel like he would be fine at home.  I think that is understandable and appropriate given his general well appearance although I cautioned him that he may get more ill as time passes.  I contacted the monoclonal antibody infusion clinic and asked them to reach out to him to see if he would be eligible for  treatment.  I prescribed medications as listed below and gave him my usual COVID-19 follow-up recommendations and return precautions.  He understands and agrees with the plan.         ____________________________________________  FINAL CLINICAL IMPRESSION(S) / ED DIAGNOSES  Final diagnoses:  Lower respiratory tract infection due to COVID-19 virus     MEDICATIONS GIVEN DURING THIS VISIT:  Medications  acetaminophen (TYLENOL) tablet 650 mg (650 mg Oral Given 01/28/20 1955)  iohexol (OMNIPAQUE) 350 MG/ML injection 100 mL (100 mLs Intravenous Contrast Given 01/28/20 2032)     ED Discharge Orders         Ordered    predniSONE (DELTASONE) 10 MG tablet        01/29/20 0044    albuterol (VENTOLIN HFA) 108 (90 Base) MCG/ACT inhaler       Note to Pharmacy: Pharmacy may substitute brand and size for insurance-approved equivalent   01/29/20 0044    chlorpheniramine-HYDROcodone (TUSSIONEX PENNKINETIC ER) 10-8 MG/5ML SUER  Every 12 hours PRN        01/29/20 0044          *Please note:  Rosey Bath was evaluated in Emergency Department on 01/29/2020 for the symptoms described in the history of present illness. He was evaluated in the context of the global COVID-19 pandemic, which necessitated consideration that the patient might be at risk for infection with the SARS-CoV-2  virus that causes COVID-19. Institutional protocols and algorithms that pertain to the evaluation of patients at risk for COVID-19 are in a state of rapid change based on information released by regulatory bodies including the CDC and federal and state organizations. These policies and algorithms were followed during the patient's care in the ED.  Some ED evaluations and interventions may be delayed as a result of limited staffing during and after the pandemic.*  Note:  This document was prepared using Dragon voice recognition software and may include unintentional dictation errors.   Loleta Rose, MD 01/29/20 [phone]

## 2020-01-29 MED ORDER — ALBUTEROL SULFATE HFA 108 (90 BASE) MCG/ACT IN AERS: INHALATION_SPRAY | RESPIRATORY_TRACT | 0 refills | Status: DC

## 2020-01-29 MED ORDER — PREDNISONE 10 MG PO TABS: ORAL_TABLET | ORAL | 0 refills | Status: DC

## 2020-01-29 MED ORDER — HYDROCOD POLST-CPM POLST ER 10-8 MG/5ML PO SUER: 5.0000 mL | Freq: Two times a day (BID) | ORAL | 0 refills | Status: DC | PRN

## 2020-01-29 NOTE — Discharge Instructions (Addendum)
As we discussed, although you have tested positive for COVID-19 (coronavirus), you do not need to be hospitalized at this time.  Read through all the included information including the recommendations from the CDC.  We recommend that you self-quarantine at home with your immediate family only (people with whom you have already been in contact) for 10-14 days after your fever has gone away (without taking medication to make your temperature come down, such as Tylenol (acetaminophen)), after your respiratory symptoms have improved, and after at least 14 days have passed since your symptoms first appeared.  You should have as minimal contact as possible with anyone else including close family as per the CDC paperwork guidelines listed below. Follow-up with your doctor by phone or online as needed and return immediately to the emergency department or call 911 only if you develop new or worsening symptoms that concern you.  If you were prescribed any medications, please use them as instructed.  If you were given information for the COVID-19 antibody infusion treatment clinic, please call them and leave your contact information.  This can be a very effective and important treatment method, and you should discuss with them if you qualify for treatment.  They may even have transportation services available to them from [place] if you require them.  You can find up-to-date information about COVID-19 in [place] by calling the [place] Coronavirus Helpline: 1-866-462-3821. You may also call 2-1-1, or 888-892-1162, or additional resources.  You can also find information online at https://www.ncdhhs.gov/divisions/public-health/coronavirus-disease-2019-covid-19-response-[place], or on the Center for Disease Control (CDC) website at https://www.cdc.gov/coronavirus/2019-ncov/index.html.  

## 2020-01-30 ENCOUNTER — Telehealth (HOSPITAL_COMMUNITY): Payer: Self-pay | Admitting: Oncology

## 2020-01-30 ENCOUNTER — Encounter: Payer: Self-pay | Admitting: Oncology

## 2020-01-30 NOTE — Telephone Encounter (Signed)
Re: [facility]  Called to Discuss with patient about Covid symptoms and the use of regeneron, a monoclonal antibody infusion for those with mild to moderate Covid symptoms and at a high risk of hospitalization.     Pt is qualified for this infusion at the [facility] infusion center due to co-morbid conditions and/or a member of an at-risk group.    Past Medical History:  Diagnosis Date  . Diabetes mellitus type 2 with complications (HCC)   . H/O: gout   . HLD (hyperlipidemia)   . Hypertension     Specific risk condition-  Unvaccinated HTN T2DM Hld Obesity  Onset of sx- either day 6 or day 7   Unable to reach pt. Left VM and MCM.  Mignon Pine, AGNP-C [phone] (Infusion Center Hotline)

## 2020-01-31 ENCOUNTER — Inpatient Hospital Stay (HOSPITAL_COMMUNITY)
Admission: EM | Admit: 2020-01-31 | Discharge: 2020-02-07 | DRG: 177 | Disposition: A | Discharge status: Home / Self Care | Payer: 59 | Attending: Internal Medicine | Admitting: Internal Medicine

## 2020-01-31 ENCOUNTER — Encounter (HOSPITAL_COMMUNITY): Payer: Self-pay | Admitting: Emergency Medicine

## 2020-01-31 ENCOUNTER — Other Ambulatory Visit: Payer: Self-pay

## 2020-01-31 ENCOUNTER — Other Ambulatory Visit: Payer: Self-pay | Admitting: Internal Medicine

## 2020-01-31 ENCOUNTER — Ambulatory Visit (HOSPITAL_COMMUNITY)
Admission: RE | Admit: 2020-01-31 | Discharge: 2020-01-31 | Disposition: A | Discharge status: Home / Self Care | Payer: 59 | Source: Ambulatory Visit | Attending: Pulmonary Disease | Admitting: Pulmonary Disease

## 2020-01-31 ENCOUNTER — Emergency Department (HOSPITAL_COMMUNITY): Payer: 59

## 2020-01-31 DIAGNOSIS — I152 Hypertension secondary to endocrine disorders: Secondary | ICD-10-CM | POA: Diagnosis present

## 2020-01-31 DIAGNOSIS — E1169 Type 2 diabetes mellitus with other specified complication: Secondary | ICD-10-CM | POA: Diagnosis present

## 2020-01-31 DIAGNOSIS — U071 COVID-19: Principal | ICD-10-CM

## 2020-01-31 DIAGNOSIS — T380X5A Adverse effect of glucocorticoids and synthetic analogues, initial encounter: Secondary | ICD-10-CM | POA: Diagnosis present

## 2020-01-31 DIAGNOSIS — I10 Essential (primary) hypertension: Secondary | ICD-10-CM | POA: Insufficient documentation

## 2020-01-31 DIAGNOSIS — E118 Type 2 diabetes mellitus with unspecified complications: Secondary | ICD-10-CM | POA: Diagnosis present

## 2020-01-31 DIAGNOSIS — J1282 Pneumonia due to coronavirus disease 2019: Secondary | ICD-10-CM | POA: Diagnosis present

## 2020-01-31 DIAGNOSIS — R0902 Hypoxemia: Secondary | ICD-10-CM | POA: Diagnosis present

## 2020-01-31 DIAGNOSIS — R262 Difficulty in walking, not elsewhere classified: Secondary | ICD-10-CM | POA: Diagnosis present

## 2020-01-31 DIAGNOSIS — Z8261 Family history of arthritis: Secondary | ICD-10-CM | POA: Diagnosis not present

## 2020-01-31 DIAGNOSIS — Z8249 Family history of ischemic heart disease and other diseases of the circulatory system: Secondary | ICD-10-CM | POA: Diagnosis not present

## 2020-01-31 DIAGNOSIS — J9601 Acute respiratory failure with hypoxia: Secondary | ICD-10-CM | POA: Diagnosis present

## 2020-01-31 DIAGNOSIS — Y92239 Unspecified place in hospital as the place of occurrence of the external cause: Secondary | ICD-10-CM | POA: Diagnosis present

## 2020-01-31 DIAGNOSIS — E785 Hyperlipidemia, unspecified: Secondary | ICD-10-CM | POA: Diagnosis present

## 2020-01-31 DIAGNOSIS — E1159 Type 2 diabetes mellitus with other circulatory complications: Secondary | ICD-10-CM | POA: Diagnosis present

## 2020-01-31 LAB — COMPREHENSIVE METABOLIC PANEL WITH GFR
ALT: 51 U/L — ABNORMAL HIGH (ref 0–44)
AST: 48 U/L — ABNORMAL HIGH (ref 15–41)
Albumin: 3.5 g/dL (ref 3.5–5.0)
Alkaline Phosphatase: 53 U/L (ref 38–126)
Anion gap: 15 (ref 5–15)
BUN: 18 mg/dL (ref 8–23)
CO2: 20 mmol/L — ABNORMAL LOW (ref 22–32)
Calcium: 8.7 mg/dL — ABNORMAL LOW (ref 8.9–10.3)
Chloride: 99 mmol/L (ref 98–111)
Creatinine, Ser: 0.97 mg/dL (ref 0.61–1.24)
GFR, Estimated: >60 mL/min (ref >60)
Glucose, Bld: 258 mg/dL — ABNORMAL HIGH (ref 70–99)
Potassium: 4.3 mmol/L (ref 3.5–5.1)
Sodium: 134 mmol/L — ABNORMAL LOW (ref 135–145)
Total Bilirubin: 1.2 mg/dL (ref 0.3–1.2)
Total Protein: 6.5 g/dL (ref 6.5–8.1)

## 2020-01-31 LAB — FERRITIN: Ferritin: 1067 ng/mL — ABNORMAL HIGH (ref 24–336)

## 2020-01-31 LAB — LACTATE DEHYDROGENASE: LDH: 481 U/L — ABNORMAL HIGH (ref 98–192)

## 2020-01-31 LAB — RESP PANEL BY RT-PCR (FLU A&B, COVID) ARPGX2
Influenza A by PCR: NEGATIVE
Influenza B by PCR: NEGATIVE
SARS Coronavirus 2 by RT PCR: POSITIVE — AB

## 2020-01-31 LAB — CBC WITH DIFFERENTIAL/PLATELET
Abs Immature Granulocytes: 0.05 10*3/uL (ref 0.00–0.07)
Basophils Absolute: 0 10*3/uL (ref 0.0–0.1)
Basophils Relative: 0 %
Eosinophils Absolute: 0 10*3/uL (ref 0.0–0.5)
Eosinophils Relative: 0 %
HCT: 43.9 % (ref 39.0–52.0)
Hemoglobin: 15.5 g/dL (ref 13.0–17.0)
Immature Granulocytes: 1 %
Lymphocytes Relative: 6 %
Lymphs Abs: 0.4 10*3/uL — ABNORMAL LOW (ref 0.7–4.0)
MCH: 32.6 pg (ref 26.0–34.0)
MCHC: 35.3 g/dL (ref 30.0–36.0)
MCV: 92.4 fL (ref 80.0–100.0)
Monocytes Absolute: 0.4 10*3/uL (ref 0.1–1.0)
Monocytes Relative: 5 %
Neutro Abs: 6 10*3/uL (ref 1.7–7.7)
Neutrophils Relative %: 88 %
Platelets: 225 10*3/uL (ref 150–400)
RBC: 4.75 MIL/uL (ref 4.22–5.81)
RDW: 11.2 % — ABNORMAL LOW (ref 11.5–15.5)
WBC: 6.8 10*3/uL (ref 4.0–10.5)
nRBC: 0 % (ref 0.0–0.2)

## 2020-01-31 LAB — LACTIC ACID, PLASMA
Lactic Acid, Venous: 1.8 mmol/L (ref 0.5–1.9)
Lactic Acid, Venous: 1.8 mmol/L (ref 0.5–1.9)

## 2020-01-31 LAB — C-REACTIVE PROTEIN: CRP: 5.3 mg/dL — ABNORMAL HIGH (ref <1.0)

## 2020-01-31 LAB — PROCALCITONIN: Procalcitonin: <0.1 ng/mL

## 2020-01-31 LAB — CBG MONITORING, ED: Glucose-Capillary: 294 mg/dL — ABNORMAL HIGH (ref 70–99)

## 2020-01-31 LAB — FIBRINOGEN: Fibrinogen: 655 mg/dL — ABNORMAL HIGH (ref 210–475)

## 2020-01-31 LAB — D-DIMER, QUANTITATIVE: D-Dimer, Quant: 0.54 ug/mL-FEU — ABNORMAL HIGH (ref 0.00–0.50)

## 2020-01-31 LAB — TRIGLYCERIDES: Triglycerides: 108 mg/dL (ref <150)

## 2020-01-31 MED ORDER — EPINEPHRINE 0.3 MG/0.3ML IJ SOAJ: 0.3000 mg | Freq: Once | INTRAMUSCULAR | Status: DC | PRN

## 2020-01-31 MED ORDER — HYDROCHLOROTHIAZIDE 25 MG PO TABS
25.0000 mg | ORAL_TABLET | Freq: Every day | ORAL | Status: DC
  Filled 2020-01-31: qty 1

## 2020-01-31 MED ORDER — ROSUVASTATIN CALCIUM 10 MG PO TABS
10.0000 mg | ORAL_TABLET | Freq: Every day | ORAL | Status: DC
  Filled 2020-01-31 (×2): qty 1

## 2020-01-31 MED ORDER — ENOXAPARIN SODIUM 40 MG/0.4ML ~~LOC~~ SOLN
40.0000 mg | SUBCUTANEOUS | Status: DC
  Administered 2020-01-31 – 2020-02-03 (×4): 40 mg via SUBCUTANEOUS
  Filled 2020-01-31 (×4): qty 0.4

## 2020-01-31 MED ORDER — ASCORBIC ACID 500 MG PO TABS
500.0000 mg | ORAL_TABLET | Freq: Every day | ORAL | Status: DC
  Administered 2020-01-31 – 2020-02-07 (×8): 500 mg via ORAL
  Filled 2020-01-31 (×8): qty 1

## 2020-01-31 MED ORDER — ALBUTEROL SULFATE HFA 108 (90 BASE) MCG/ACT IN AERS: 2.0000 | INHALATION_SPRAY | Freq: Once | RESPIRATORY_TRACT | Status: DC | PRN

## 2020-01-31 MED ORDER — SODIUM CHLORIDE 0.9 % IV SOLN: INTRAVENOUS | Status: DC | PRN

## 2020-01-31 MED ORDER — INSULIN ASPART 100 UNIT/ML ~~LOC~~ SOLN
0.0000 [IU] | Freq: Three times a day (TID) | SUBCUTANEOUS | Status: DC
  Administered 2020-02-01: 8 [IU] via SUBCUTANEOUS
  Administered 2020-02-01: 5 [IU] via SUBCUTANEOUS
  Administered 2020-02-01: 15 [IU] via SUBCUTANEOUS
  Administered 2020-02-02: 8 [IU] via SUBCUTANEOUS
  Administered 2020-02-02 (×2): 5 [IU] via SUBCUTANEOUS
  Administered 2020-02-03 (×2): 8 [IU] via SUBCUTANEOUS
  Administered 2020-02-03: 5 [IU] via SUBCUTANEOUS
  Administered 2020-02-04: 11 [IU] via SUBCUTANEOUS
  Administered 2020-02-04: 3 [IU] via SUBCUTANEOUS
  Administered 2020-02-04: 5 [IU] via SUBCUTANEOUS
  Administered 2020-02-05: 3 [IU] via SUBCUTANEOUS
  Administered 2020-02-05: 5 [IU] via SUBCUTANEOUS
  Administered 2020-02-05: 3 [IU] via SUBCUTANEOUS
  Administered 2020-02-06: 11 [IU] via SUBCUTANEOUS
  Administered 2020-02-06 – 2020-02-07 (×3): 5 [IU] via SUBCUTANEOUS
  Filled 2020-01-31: qty 0.15

## 2020-01-31 MED ORDER — AMLODIPINE BESYLATE 10 MG PO TABS
10.0000 mg | ORAL_TABLET | Freq: Every day | ORAL | Status: DC
  Administered 2020-02-01 – 2020-02-07 (×7): 10 mg via ORAL
  Filled 2020-01-31: qty 1
  Filled 2020-01-31: qty 2
  Filled 2020-01-31 (×5): qty 1

## 2020-01-31 MED ORDER — SODIUM CHLORIDE 0.9 % IV SOLN
200.0000 mg | Freq: Once | INTRAVENOUS | Status: DC
  Filled 2020-01-31: qty 40

## 2020-01-31 MED ORDER — DIPHENHYDRAMINE HCL 50 MG/ML IJ SOLN: 50.0000 mg | Freq: Once | INTRAMUSCULAR | Status: DC | PRN

## 2020-01-31 MED ORDER — SODIUM CHLORIDE 0.9 % IV SOLN: Freq: Once | INTRAVENOUS | Status: DC

## 2020-01-31 MED ORDER — FAMOTIDINE IN NACL 20-0.9 MG/50ML-% IV SOLN: 20.0000 mg | Freq: Once | INTRAVENOUS | Status: DC | PRN

## 2020-01-31 MED ORDER — METHYLPREDNISOLONE SODIUM SUCC 40 MG IJ SOLR
40.0000 mg | Freq: Two times a day (BID) | INTRAMUSCULAR | Status: DC
  Administered 2020-01-31 – 2020-02-07 (×14): 40 mg via INTRAVENOUS
  Filled 2020-01-31 (×14): qty 1

## 2020-01-31 MED ORDER — ACETAMINOPHEN 325 MG PO TABS: 650.0000 mg | ORAL_TABLET | Freq: Four times a day (QID) | ORAL | Status: DC | PRN

## 2020-01-31 MED ORDER — ALBUTEROL SULFATE HFA 108 (90 BASE) MCG/ACT IN AERS
2.0000 | INHALATION_SPRAY | Freq: Four times a day (QID) | RESPIRATORY_TRACT | Status: DC | PRN
  Filled 2020-01-31: qty 6.7

## 2020-01-31 MED ORDER — HYDROCOD POLST-CPM POLST ER 10-8 MG/5ML PO SUER: 5.0000 mL | Freq: Two times a day (BID) | ORAL | Status: DC | PRN

## 2020-01-31 MED ORDER — DEXAMETHASONE SODIUM PHOSPHATE 10 MG/ML IJ SOLN
6.0000 mg | Freq: Once | INTRAMUSCULAR | Status: AC
  Administered 2020-01-31: 6 mg via INTRAVENOUS
  Filled 2020-01-31: qty 1

## 2020-01-31 MED ORDER — ONDANSETRON HCL 4 MG/2ML IJ SOLN: 4.0000 mg | Freq: Four times a day (QID) | INTRAMUSCULAR | Status: DC | PRN

## 2020-01-31 MED ORDER — ONDANSETRON HCL 4 MG PO TABS: 4.0000 mg | ORAL_TABLET | Freq: Four times a day (QID) | ORAL | Status: DC | PRN

## 2020-01-31 MED ORDER — SODIUM CHLORIDE 0.9 % IV SOLN: 100.0000 mg | Freq: Every day | INTRAVENOUS | Status: DC

## 2020-01-31 MED ORDER — ZINC SULFATE 220 (50 ZN) MG PO CAPS
220.0000 mg | ORAL_CAPSULE | Freq: Every day | ORAL | Status: DC
  Administered 2020-01-31 – 2020-02-07 (×8): 220 mg via ORAL
  Filled 2020-01-31 (×8): qty 1

## 2020-01-31 MED ORDER — GUAIFENESIN-DM 100-10 MG/5ML PO SYRP
10.0000 mL | ORAL_SOLUTION | ORAL | Status: DC | PRN
  Administered 2020-02-04: 10 mL via ORAL
  Filled 2020-01-31: qty 10

## 2020-01-31 MED ORDER — METOPROLOL SUCCINATE ER 50 MG PO TB24
50.0000 mg | ORAL_TABLET | Freq: Every day | ORAL | Status: DC
  Administered 2020-02-01 – 2020-02-07 (×7): 50 mg via ORAL
  Filled 2020-01-31 (×7): qty 1

## 2020-01-31 MED ORDER — INSULIN ASPART 100 UNIT/ML ~~LOC~~ SOLN
0.0000 [IU] | Freq: Every day | SUBCUTANEOUS | Status: DC
  Administered 2020-01-31: 3 [IU] via SUBCUTANEOUS
  Administered 2020-02-01 – 2020-02-03 (×3): 2 [IU] via SUBCUTANEOUS
  Administered 2020-02-04: 4 [IU] via SUBCUTANEOUS
  Administered 2020-02-06: 2 [IU] via SUBCUTANEOUS
  Filled 2020-01-31: qty 0.05

## 2020-01-31 MED ORDER — METHYLPREDNISOLONE SODIUM SUCC 125 MG IJ SOLR: 125.0000 mg | Freq: Once | INTRAMUSCULAR | Status: DC | PRN

## 2020-01-31 MED ORDER — ALBUTEROL SULFATE HFA 108 (90 BASE) MCG/ACT IN AERS
4.0000 | INHALATION_SPRAY | Freq: Once | RESPIRATORY_TRACT | Status: AC
  Administered 2020-01-31: 4 via RESPIRATORY_TRACT
  Filled 2020-01-31: qty 6.7

## 2020-01-31 MED ORDER — RAMIPRIL 5 MG PO CAPS
5.0000 mg | ORAL_CAPSULE | Freq: Two times a day (BID) | ORAL | Status: DC
  Administered 2020-02-01 – 2020-02-02 (×4): 5 mg via ORAL
  Filled 2020-01-31 (×5): qty 1

## 2020-01-31 NOTE — ED Triage Notes (Signed)
Pt coming from the infusion center with complaints of low oxygen sats. Infusion nurse reports pt sats were 88-89% RA. Pt placed on 5L [place] and reports sats got better. All other vital stable. Tested positive Monday 01/28/20

## 2020-01-31 NOTE — Progress Notes (Signed)
I connected by phone with Andrew Cross on 01/31/2020 at 10:13 AM to discuss the potential use of a new treatment for mild to moderate COVID-19 viral infection in non-hospitalized patients.  This patient is a 64 y.o. male that meets the FDA criteria for Emergency Use Authorization of COVID monoclonal antibody casirivimab/imdevimab, bamlanivimab/etesevimab, or sotrovimab.  Has a (+) direct SARS-CoV-2 viral test result  Has mild or moderate COVID-19   Is NOT hospitalized due to COVID-19  Is within 10 days of symptom onset  Has at least one of the high risk factor(s) for progression to severe COVID-19 and/or hospitalization as defined in EUA.  Specific high risk criteria : Diabetes   I have spoken and communicated the following to the patient or parent/caregiver regarding COVID monoclonal antibody treatment:  1. FDA has authorized the emergency use for the treatment of mild to moderate COVID-19 in adults and pediatric patients with positive results of direct SARS-CoV-2 viral testing who are 82 years of age and older weighing at least 40 kg, and who are at high risk for progressing to severe COVID-19 and/or hospitalization.  2. The significant known and potential risks and benefits of COVID monoclonal antibody, and the extent to which such potential risks and benefits are unknown.  3. Information on available alternative treatments and the risks and benefits of those alternatives, including clinical trials.  4. Patients treated with COVID monoclonal antibody should continue to self-isolate and use infection control measures (e.g., wear mask, isolate, social distance, avoid sharing personal items, clean and disinfect "high touch" surfaces, and frequent handwashing) according to CDC guidelines.   5. The patient or parent/caregiver has the option to accept or refuse COVID monoclonal antibody treatment.  After reviewing this information with the patient, the patient has agreed to receive one  of the available covid 19 monoclonal antibodies and will be provided an appropriate fact sheet prior to infusion.  Cyndee Brightly, NP [facility]

## 2020-01-31 NOTE — ED Notes (Signed)
Pt refusing remdesivir. States he believes it will harm him.

## 2020-01-31 NOTE — ED Notes (Signed)
Pt in bed watching tv  

## 2020-01-31 NOTE — H&P (Signed)
History and Physical    LAVONTAY KIRK UUE:[id number] DOB: [birth year] DOA: 01/31/2020  PCP: Susy Frizzle, MD  Patient coming from: Infusion center  I have personally briefly reviewed patient's old medical records in [place]  Chief Complaint: Hypoxia, COVID-19 infection  HPI: Andrew Cross is a 64 y.o. male with medical history significant for type 2 diabetes, hypertension, hyperlipidemia who presents to the ED from the infusion center for evaluation of hypoxia in the setting of COVID-19 infection.  Patient state he began feeling unwell on Christmas day.  Symptoms were generalized fatigue, low energy, cough productive of thick white sputum.  2 days later he took an at home Covid test which had a positive.  He was seen [facility] ED 01/28/2020.  CTA chest PE study was negative for PE but did show fairly widespread bilateral groundglass opacities and patchy consolidations.  He was felt to be hemodynamically stable.  Ambulation to assess O2 sats with exertion was offered however patient declined per ED documentation.  He was set up for monoclonal antibody infusion with prescription for prednisone taper.  In the infusion clinic earlier today he was found to have hypoxia to 88% on room air.  He was placed on 5 L O2 and sent to the ED for further evaluation.  Patient states in addition to the above symptoms last night he developed night sweats.  This morning he was unable to walk to the bathroom due to significantly generalized weakness and low energy.  He otherwise denies any nausea, vomiting, chest pain, abdominal pain, dysuria, diarrhea.  ED Course:  Initial vitals showed BP 173/82, pulse 93, RR 14, temp 98.6 F, SPO2 93% on 5 L supplemental O2 via [place].  Labs show sodium 134, potassium 4.3, bicarb 20, BUN 18, creatinine 0.97, serum glucose 258, AST 48, ALT 51, alk phos 53, total bilirubin 1.2, WBC 6.8, hemoglobin 15.5, platelets 225,000, lactic acid 1.8, D-dimer 0.54, ferritin 1067, CRP  5.3.  Blood cultures were obtained and pending.  SARS-CoV-2 PCR is positive.  Portable chest x-ray shows patchy bilateral airspace opacities in the mid to lower lung zone predominant distribution.  Patient was given IV Decadron 6 mg and albuterol inhaler treatment.  Per EDP, patient refused IV remdesivir treatment.  The hospitalist service was consulted to admit for further evaluation and management.  Review of Systems: All systems reviewed and are negative except as documented in history of present illness above.   Past Medical History:  Diagnosis Date  . Diabetes mellitus type 2 with complications ([place])   . H/O: gout   . HLD (hyperlipidemia)   . Hypertension     Past Surgical History:  Procedure Laterality Date  . FRACTURE SURGERY  1970 / 1980   left forearm / right ankle  . VASECTOMY  1993    Social History:  reports that he has never smoked. He has never used smokeless tobacco. He reports previous alcohol use. He reports that he does not use drugs.  No Known Allergies  Family History  Problem Relation Age of Onset  . Hypertension Mother   . Hypertension Father   . Hypertension Sister   . Arthritis Maternal Grandmother   . Hypertension Maternal Grandmother   . Hypertension Maternal Grandfather   . Hypertension Paternal Grandmother   . Alcohol abuse Paternal Grandfather   . Hypertension Paternal Grandfather      Prior to Admission medications   Medication Sig Start Date End Date Taking? Authorizing Provider  albuterol (VENTOLIN HFA) 108 (  90 Base) MCG/ACT inhaler Inhale 2-4 puffs by mouth every 4 hours as needed for wheezing, cough, and/or shortness of breath 01/29/20   Hinda Kehr, MD  amLODipine (NORVASC) 10 MG tablet Take 1 tablet (10 mg total) by mouth daily. 07/12/19   Susy Frizzle, MD  amLODipine (NORVASC) 5 MG tablet TAKE 1 TABLET BY MOUTH EVERY DAY 10/31/19   Susy Frizzle, MD  chlorpheniramine-HYDROcodone [facility] PENNKINETIC ER) 10-8 MG/5ML SUER  Take 5 mLs by mouth every 12 (twelve) hours as needed for cough. 01/29/20   Hinda Kehr, MD  hydrochlorothiazide (HYDRODIURIL) 25 MG tablet TAKE 1 TABLET BY MOUTH EVERY DAY 06/04/19   Susy Frizzle, MD  ibuprofen (ADVIL,MOTRIN) 200 MG tablet Take 200 mg by mouth as needed.    [provider]  metFORMIN (GLUCOPHAGE) 500 MG tablet TAKE 1 TABLET (500 MG TOTAL) BY MOUTH 2 (TWO) TIMES DAILY WITH A MEAL. 01/04/20   Susy Frizzle, MD  metoprolol succinate (TOPROL-XL) 50 MG 24 hr tablet TAKE 1 TABLET (50 MG TOTAL) BY MOUTH DAILY. TAKE WITH OR IMMEDIATELY FOLLOWING A MEAL. 06/04/19   Susy Frizzle, MD  predniSONE (DELTASONE) 10 MG tablet Take 6 tabs (60 mg) PO x 3 days, then take 4 tabs (40 mg) PO x 3 days, then take 2 tabs (20 mg) PO x 3 days, then take 1 tab (10 mg) PO x 3 days, then take 1/2 tab (5 mg) PO x 4 days. 01/29/20   Hinda Kehr, MD  ramipril (ALTACE) 5 MG capsule TAKE 1 CAPSULE (5 MG TOTAL) BY MOUTH 2 (TWO) TIMES DAILY. 12/06/19   Susy Frizzle, MD  rosuvastatin (CRESTOR) 10 MG tablet Take 1 tablet (10 mg total) by mouth daily. 11/06/19   Susy Frizzle, MD    Physical Exam: Vitals:   01/31/20 1730 01/31/20 1800 01/31/20 1820 01/31/20 1855  BP: (!) 159/83 (!) 144/80    Pulse: 91 88    Resp: 17 15    Temp:      TempSrc:      SpO2: 93% 91% 92% 91%   Constitutional: Resting in bed with head elevated NAD, calm, comfortable Eyes: PERRL, lids and conjunctivae normal ENMT: Mucous membranes are moist. Posterior pharynx clear of any exudate or lesions.Normal dentition.  Neck: normal, supple, no masses. Respiratory: Patchy inspiratory crackles.  Normal respiratory effort. No accessory muscle use.  Cardiovascular: Regular rate and rhythm, no murmurs / rubs / gallops. No extremity edema. 2+ pedal pulses. Abdomen: no tenderness, no masses palpated. No hepatosplenomegaly. Bowel sounds positive.  Musculoskeletal: no clubbing / cyanosis. No joint deformity upper and lower  extremities. Good ROM, no contractures. Normal muscle tone.  Skin: no rashes, lesions, ulcers. No induration Neurologic: CN 2-12 grossly intact. Sensation intact, Strength 5/5 in all 4.  Psychiatric: Normal judgment and insight. Alert and oriented x 3. Normal mood.   Labs on Admission: I have personally reviewed following labs and imaging studies  CBC: Recent Labs  Lab 01/28/20 1951 01/31/20 1437  WBC 6.4 6.8  NEUTROABS 5.0 6.0  HGB 16.0 15.5  HCT 44.0 43.9  MCV 89.8 92.4  PLT 150 540   Basic Metabolic Panel: Recent Labs  Lab 01/28/20 1951 01/31/20 1437  NA 130* 134*  K 4.1 4.3  CL 95* 99  CO2 22 20*  GLUCOSE 200* 258*  BUN 16 18  CREATININE 1.11 0.97  CALCIUM 8.5* 8.7*   GFR: Estimated Creatinine Clearance: 102.3 mL/min (by C-G formula based on SCr of 0.97  mg/dL). Liver Function Tests: Recent Labs  Lab 01/28/20 1951 01/31/20 1437  AST 64* 48*  ALT 43 51*  ALKPHOS 54 53  BILITOT 1.0 1.2  PROT 6.7 6.5  ALBUMIN 3.8 3.5   No results for input(s): LIPASE, AMYLASE in the last 168 hours. No results for input(s): AMMONIA in the last 168 hours. Coagulation Profile: No results for input(s): INR, PROTIME in the last 168 hours. Cardiac Enzymes: No results for input(s): CKTOTAL, CKMB, CKMBINDEX, TROPONINI in the last 168 hours. BNP (last 3 results) No results for input(s): PROBNP in the last 8760 hours. HbA1C: No results for input(s): HGBA1C in the last 72 hours. CBG: No results for input(s): GLUCAP in the last 168 hours. Lipid Profile: Recent Labs    01/31/20 1437  TRIG 108   Thyroid Function Tests: No results for input(s): TSH, T4TOTAL, FREET4, T3FREE, THYROIDAB in the last 72 hours. Anemia Panel: Recent Labs    01/31/20 1437  FERRITIN 1,067*   Urine analysis: No results found for: COLORURINE, APPEARANCEUR, LABSPEC, PHURINE, GLUCOSEU, HGBUR, BILIRUBINUR, KETONESUR, PROTEINUR, UROBILINOGEN, NITRITE, LEUKOCYTESUR  Radiological Exams on Admission: DG  Chest Port 1 View  Result Date: 01/31/2020 CLINICAL DATA:  COVID.  Decreased oxygen saturation. EXAM: PORTABLE CHEST 1 VIEW COMPARISON:  Chest CTA 3 days ago. FINDINGS: Upper normal heart size with normal mediastinal contours. Patchy heterogeneous bilateral airspace opacities in a mid-lower lung zone predominant distribution. There may be slight improvement from recent CT. No evidence of pneumomediastinum. No pneumothorax. No pleural fluid. Stable osseous structures. IMPRESSION: Patchy heterogeneous bilateral airspace opacities in a mid-lower lung zone predominant distribution consistent with COVID pneumonia. There may be slight improvement from recent CT. Electronically Signed   By: Keith Rake M.D.   On: 01/31/2020 15:32    EKG: Personally reviewed. Normal sinus rhythm without acute ischemic changes.  Assessment/Plan Principal Problem:   Acute hypoxemic respiratory failure due to COVID-19 [facility]) Active Problems:   Hypertension associated with diabetes ([place])   Diabetes mellitus type 2 with complications ([place])   Hyperlipidemia associated with type 2 diabetes mellitus ([place])  JAMARQUES PINEDO is a 64 y.o. male with medical history significant for type 2 diabetes, hypertension, hyperlipidemia who is admitted with acute hypoxemic respiratory failure due to COVID-19 pneumonia.  Acute hypoxemic respiratory failure due to COVID-19 pneumonia: Reports symptoms for at least 5 days with positive home test on 01/28/2020.  CTA 12/27 also showed patchy pneumonia.  Hypoxic down to 88% on room air at the infusion clinic prior to monoclonal antibody treatment which he ultimately did not receive.  Currently requiring 5 L O2 via  to maintain O2 sats greater than 90%.  No role for antibiotics at this point due to no evidence of bacterial infection. -Start IV Solu-Medrol 40 mg twice daily -Patient refused remdesivir treatment -Continue supplemental O2 and wean as able -Incentive spirometer, flutter valve,  albuterol as needed  Type 2 diabetes: Moderate SSI, Hs coverage, hold Metformin for now.  Adjust as needed while on steroids.  Hypertension: Resume home amlodipine, HCTZ, Toprol-XL, ramipril.  Hyperlipidemia: Continue rosuvastatin.  DVT prophylaxis: Lovenox Code Status: Full code, confirmed with patient Family Communication: Discussed with patient's daughter by phone Disposition Plan: From home and likely discharged home pending improvement in respiratory status Consults called: None Admission status:  Status is: Inpatient  Remains inpatient appropriate because:IV treatments appropriate due to intensity of illness or inability to take PO   Dispo: The patient is from: Home  Anticipated d/c is to: Home              Anticipated d/c date is: 2 days              Patient currently is not medically stable to d/c.  Zada Finders MD [facility]  If 7PM-7AM, please contact night-coverage [email or web address]  01/31/2020, 9:07 PM

## 2020-01-31 NOTE — ED Notes (Signed)
hospitalist in room with pt 

## 2020-01-31 NOTE — ED Notes (Signed)
Pt is coming from infusion clinic due to sats in the 80's, 3 L [place] in mid to upper 90's.

## 2020-01-31 NOTE — Progress Notes (Addendum)
Pt's O2 level 88-89 on RA.  Placed on [place] 2L- 92%.  Increased to 4L- 92-93%.  Increased to 5 L and 93-95%.  Overall, he feels he can breath better with Carl 02.  Denies shortness of breath.  Notes feeling as if he was going to pass out in the shower this morning.  He utilized his inhaler x 3 (albuterol) this morning- has chest congestion and cough, but unable to expectorate.  Has not had much fluid today and did not take any of his hypertension Rx this AM.   Seen at [facility] on Monday for symptoms of sinus infection; HA with tightness of head/ face and sinus cavity; blurry vision began over past two days.  Started prednisone and Zithromax on Monday after being seen at [facility]- chest x-ray was clear.  He started Levaquin this morning, as Zithromax isn't effective for pt against sinus infections.

## 2020-01-31 NOTE — ED Provider Notes (Signed)
[facility]-EMERGENCY DEPT Provider Note   CSN: [id number] Arrival date & time: 01/31/20  1420     History Chief Complaint  Patient presents with  . Covid Positive    Low sats     Andrew Cross is a 64 y.o. male with PMHx HTN, HLD, and diabetes who presents to the ED today from the infusion center after being found to be hypoxic at 88-89% on RA. He was placed on 2L initially and then increased to 5L satting 93-95% and sent to the ED for further evaluation. Pt reports he began having symptoms of a cold on 12/25. He took an at home test on 12/27 and tested positive. He states that yesterday he was feeling improved and was able to go outside and take a walk however last night he woke up drenched in sweat. He did not take his temperature. He states that this morning he felt even more fatigued and lightheaded with dyspnea on exertion. He went to the infusion center today and was noted to be hypoxic and sent here. Pt reports he feels improved on the oxygen. No other complaints at this time. Has been using an inhaler at home with mild relief; last used 3 puffs this morning around 7 AM.    The history is provided by the patient and medical records.       Past Medical History:  Diagnosis Date  . Diabetes mellitus type 2 with complications ([facility])   . H/O: gout   . HLD (hyperlipidemia)   . Hypertension     Patient Active Problem List   Diagnosis Date Noted  . Diabetes mellitus type 2 with complications ([facility])   . HLD (hyperlipidemia)   . Hypertension   . H/O: gout     Past Surgical History:  Procedure Laterality Date  . FRACTURE SURGERY  1970 / 1980   left forearm / right ankle  . VASECTOMY  1993       Family History  Problem Relation Age of Onset  . Hypertension Mother   . Hypertension Father   . Hypertension Sister   . Arthritis Maternal Grandmother   . Hypertension Maternal Grandmother   . Hypertension Maternal Grandfather   . Hypertension Paternal  Grandmother   . Alcohol abuse Paternal Grandfather   . Hypertension Paternal Grandfather     Social History   Tobacco Use  . Smoking status: Never Smoker  . Smokeless tobacco: Never Used  Substance Use Topics  . Alcohol use: Not Currently    Comment: 2 mixed drinks a month  . Drug use: No    Home Medications Prior to Admission medications   Medication Sig Start Date End Date Taking? Authorizing Provider  albuterol (VENTOLIN HFA) 108 (90 Base) MCG/ACT inhaler Inhale 2-4 puffs by mouth every 4 hours as needed for wheezing, cough, and/or shortness of breath 01/29/20   Loleta Rose, MD  amLODipine (NORVASC) 10 MG tablet Take 1 tablet (10 mg total) by mouth daily. 07/12/19   Donita Brooks, MD  amLODipine (NORVASC) 5 MG tablet TAKE 1 TABLET BY MOUTH EVERY DAY 10/31/19   Donita Brooks, MD  chlorpheniramine-HYDROcodone [facility] PENNKINETIC ER) 10-8 MG/5ML SUER Take 5 mLs by mouth every 12 (twelve) hours as needed for cough. 01/29/20   Loleta Rose, MD  hydrochlorothiazide (HYDRODIURIL) 25 MG tablet TAKE 1 TABLET BY MOUTH EVERY DAY 06/04/19   Donita Brooks, MD  ibuprofen (ADVIL,MOTRIN) 200 MG tablet Take 200 mg by mouth as needed.  [provider]  metFORMIN (GLUCOPHAGE) 500 MG tablet TAKE 1 TABLET (500 MG TOTAL) BY MOUTH 2 (TWO) TIMES DAILY WITH A MEAL. 01/04/20   Donita Brooks, MD  metoprolol succinate (TOPROL-XL) 50 MG 24 hr tablet TAKE 1 TABLET (50 MG TOTAL) BY MOUTH DAILY. TAKE WITH OR IMMEDIATELY FOLLOWING A MEAL. 06/04/19   Donita Brooks, MD  predniSONE (DELTASONE) 10 MG tablet Take 6 tabs (60 mg) PO x 3 days, then take 4 tabs (40 mg) PO x 3 days, then take 2 tabs (20 mg) PO x 3 days, then take 1 tab (10 mg) PO x 3 days, then take 1/2 tab (5 mg) PO x 4 days. 01/29/20   Loleta Rose, MD  ramipril (ALTACE) 5 MG capsule TAKE 1 CAPSULE (5 MG TOTAL) BY MOUTH 2 (TWO) TIMES DAILY. 12/06/19   Donita Brooks, MD  rosuvastatin (CRESTOR) 10 MG tablet Take 1 tablet (10  mg total) by mouth daily. 11/06/19   Donita Brooks, MD    Allergies    Patient has no known allergies.  Review of Systems   Review of Systems  Constitutional: Positive for chills, fatigue and fever.  Respiratory: Positive for cough and shortness of breath.   Cardiovascular: Negative for chest pain.  Gastrointestinal: Negative for abdominal pain, diarrhea, nausea and vomiting.  All other systems reviewed and are negative.   Physical Exam Updated Vital Signs BP (!) 183/84 (BP Location: Left Arm)   Pulse 88   Temp 98.6 F (37 C) (Oral)   Resp 14   SpO2 96%   Physical Exam Vitals and nursing note reviewed.  Constitutional:      Appearance: He is obese. He is not ill-appearing or diaphoretic.  HENT:     Head: Normocephalic and atraumatic.     Nose: No rhinorrhea.  Eyes:     Conjunctiva/sclera: Conjunctivae normal.  Cardiovascular:     Rate and Rhythm: Normal rate and regular rhythm.     Pulses: Normal pulses.  Pulmonary:     Effort: Pulmonary effort is normal.     Breath sounds: Wheezing present. No rhonchi or rales.     Comments: Speaking in short sentences without difficulty however mild increased work of breathing. Slight inspiratory wheezes throughout the right upper lung field. No rhonchi or rales. Satting 96% on 5L [place].  Abdominal:     Palpations: Abdomen is soft.     Tenderness: There is no abdominal tenderness. There is no guarding or rebound.  Musculoskeletal:     Cervical back: Neck supple.  Skin:    General: Skin is warm and dry.  Neurological:     Mental Status: He is alert.     ED Results / Procedures / Treatments   Labs (all labs ordered are listed, but only abnormal results are displayed) Labs Reviewed  RESP PANEL BY RT-PCR (FLU A&B, COVID) ARPGX2 - Abnormal; Notable for the following components:      Result Value   SARS Coronavirus 2 by RT PCR POSITIVE (*)    All other components within normal limits  CBC WITH DIFFERENTIAL/PLATELET - Abnormal;  Notable for the following components:   RDW 11.2 (*)    Lymphs Abs 0.4 (*)    All other components within normal limits  COMPREHENSIVE METABOLIC PANEL - Abnormal; Notable for the following components:   Sodium 134 (*)    CO2 20 (*)    Glucose, Bld 258 (*)    Calcium 8.7 (*)    AST 48 (*)  ALT 51 (*)    All other components within normal limits  D-DIMER, QUANTITATIVE (NOT AT [facility]) - Abnormal; Notable for the following components:   D-Dimer, Quant 0.54 (*)    All other components within normal limits  LACTATE DEHYDROGENASE - Abnormal; Notable for the following components:   LDH 481 (*)    All other components within normal limits  FERRITIN - Abnormal; Notable for the following components:   Ferritin 1,067 (*)    All other components within normal limits  FIBRINOGEN - Abnormal; Notable for the following components:   Fibrinogen 655 (*)    All other components within normal limits  C-REACTIVE PROTEIN - Abnormal; Notable for the following components:   CRP 5.3 (*)    All other components within normal limits  CULTURE, BLOOD (ROUTINE X 2)  CULTURE, BLOOD (ROUTINE X 2)  LACTIC ACID, PLASMA  LACTIC ACID, PLASMA  PROCALCITONIN  TRIGLYCERIDES    EKG None  Radiology DG Chest Port 1 View  Result Date: 01/31/2020 CLINICAL DATA:  COVID.  Decreased oxygen saturation. EXAM: PORTABLE CHEST 1 VIEW COMPARISON:  Chest CTA 3 days ago. FINDINGS: Upper normal heart size with normal mediastinal contours. Patchy heterogeneous bilateral airspace opacities in a mid-lower lung zone predominant distribution. There may be slight improvement from recent CT. No evidence of pneumomediastinum. No pneumothorax. No pleural fluid. Stable osseous structures. IMPRESSION: Patchy heterogeneous bilateral airspace opacities in a mid-lower lung zone predominant distribution consistent with COVID pneumonia. There may be slight improvement from recent CT. Electronically Signed   By: Narda Rutherford M.D.   On:  01/31/2020 15:32    Procedures Procedures (including critical care time)  Medications Ordered in ED Medications  albuterol (VENTOLIN HFA) 108 (90 Base) MCG/ACT inhaler 4 puff (4 puffs Inhalation Given 01/31/20 1456)  dexamethasone (DECADRON) injection 6 mg (6 mg Intravenous Given 01/31/20 1703)    ED Course  I have reviewed the triage vital signs and the nursing notes.  Pertinent labs & imaging results that were available during my care of the patient were reviewed by me and considered in my medical decision making (see chart for details).  Clinical Course as of 01/31/20 1841  Thu Jan 31, 2020  1804 SARS Coronavirus 2 by RT PCR(!): POSITIVE [MV]    Clinical Course User Index [MV] Tanda Rockers, [place]   MDM Rules/Calculators/A&P                          Andrew Cross was evaluated in Emergency Department on 01/31/2020 for the symptoms described in the history of present illness. He was evaluated in the context of the global COVID-19 pandemic, which necessitated consideration that the patient might be at risk for infection with the SARS-CoV-2 virus that causes COVID-19. Institutional protocols and algorithms that pertain to the evaluation of patients at risk for COVID-19 are in a state of rapid change based on information released by regulatory bodies including the CDC and federal and state organizations. These policies and algorithms were followed during the patient's care in the ED.  63 year old comes to the ED patient clinic today after found to be hypoxic 88%; not typically on O2 and placed on 5L with improvement to 93-95%.  Began having symptoms on 12/25 with + home test on 12/27. Pt is unvaccinated. On arrival to the ED vitals are stable. Pt is afebrile, nontachycardic, and nontachypneic. Reports improvement in symptoms with O2 on. He apperas to be in NAD. He does have  some increased work of breathing however and speaking in short sentences. He has some inspiratory wheezes  throughout the right lung field; last used his inhaler today around 7 AM. Given new oxygen requirement pt will need to be admitted. Will order pre admission COVID orders at this time. We do not have a positive COVID test in our system and therefore will obtain one at this time.   CXR consisted with COVID pneumonia CBC without leukocytosis. Hgb stable at 15.5.  CMP with sodium 134, glucose 258, bicarb 20 .No gap. Pt with history of diabetes on metformin; not consistent with DKA.  D dimer, fibrinogen, LDH, CRP, and ferritin all elevated. Still awaiting COVID test however with CXR findings will order remdesivir and decadron.   Nursing staff informed that pt has refused Remdesivir at this time however will accept decadron. Remdesivir cancelled.   COVID test has returned positive. Will consult for admission given hypoxia with new oxygen requirement.   Discussed case with [facility] Dr. Patel who agrees to evaluaAllena Katzte patient for admission.   This note was prepared using Dragon voice recognition software and may include unintentional dictation errors due to the inherent limitations of voice recognition software.  Final Clinical Impression(s) / ED Diagnoses Final diagnoses:  COVID-19  Hypoxia    Rx / DC Orders ED Discharge Orders    None       Tanda RockersVenter, Cordelro Gautreau, PA-C 01/31/20 1841    Arby BarrettePfeiffer, Marcy, MD 02/01/20 1041

## 2020-02-01 DIAGNOSIS — J9601 Acute respiratory failure with hypoxia: Secondary | ICD-10-CM | POA: Diagnosis not present

## 2020-02-01 DIAGNOSIS — U071 COVID-19: Secondary | ICD-10-CM | POA: Diagnosis not present

## 2020-02-01 LAB — GLUCOSE, CAPILLARY
Glucose-Capillary: 255 mg/dL — ABNORMAL HIGH (ref 70–99)
Glucose-Capillary: 202 mg/dL — ABNORMAL HIGH (ref 70–99)

## 2020-02-01 LAB — C-REACTIVE PROTEIN: CRP: 4.7 mg/dL — ABNORMAL HIGH (ref <1.0)

## 2020-02-01 LAB — CBC WITH DIFFERENTIAL/PLATELET
Abs Immature Granulocytes: 0.04 10*3/uL (ref 0.00–0.07)
Basophils Absolute: 0 10*3/uL (ref 0.0–0.1)
Basophils Relative: 0 %
Eosinophils Absolute: 0 10*3/uL (ref 0.0–0.5)
Eosinophils Relative: 0 %
HCT: 40.6 % (ref 39.0–52.0)
Hemoglobin: 14.3 g/dL (ref 13.0–17.0)
Immature Granulocytes: 1 %
Lymphocytes Relative: 10 %
Lymphs Abs: 0.6 10*3/uL — ABNORMAL LOW (ref 0.7–4.0)
MCH: 32.6 pg (ref 26.0–34.0)
MCHC: 35.2 g/dL (ref 30.0–36.0)
MCV: 92.5 fL (ref 80.0–100.0)
Monocytes Absolute: 0.3 10*3/uL (ref 0.1–1.0)
Monocytes Relative: 5 %
Neutro Abs: 5 10*3/uL (ref 1.7–7.7)
Neutrophils Relative %: 84 %
Platelets: 246 10*3/uL (ref 150–400)
RBC: 4.39 MIL/uL (ref 4.22–5.81)
RDW: 11.3 % — ABNORMAL LOW (ref 11.5–15.5)
WBC: 6 10*3/uL (ref 4.0–10.5)
nRBC: 0 % (ref 0.0–0.2)

## 2020-02-01 LAB — PHOSPHORUS: Phosphorus: 4.1 mg/dL (ref 2.5–4.6)

## 2020-02-01 LAB — COMPREHENSIVE METABOLIC PANEL
ALT: 46 U/L — ABNORMAL HIGH (ref 0–44)
AST: 31 U/L (ref 15–41)
Albumin: 3.3 g/dL — ABNORMAL LOW (ref 3.5–5.0)
Alkaline Phosphatase: 47 U/L (ref 38–126)
Anion gap: 10 (ref 5–15)
BUN: 19 mg/dL (ref 8–23)
CO2: 25 mmol/L (ref 22–32)
Calcium: 8.8 mg/dL — ABNORMAL LOW (ref 8.9–10.3)
Chloride: 102 mmol/L (ref 98–111)
Creatinine, Ser: 1.02 mg/dL (ref 0.61–1.24)
GFR, Estimated: >60 mL/min (ref >60)
Glucose, Bld: 237 mg/dL — ABNORMAL HIGH (ref 70–99)
Potassium: 4.7 mmol/L (ref 3.5–5.1)
Sodium: 137 mmol/L (ref 135–145)
Total Bilirubin: 1.2 mg/dL (ref 0.3–1.2)
Total Protein: 6.4 g/dL — ABNORMAL LOW (ref 6.5–8.1)

## 2020-02-01 LAB — HEMOGLOBIN A1C
Hgb A1c MFr Bld: 7.9 % — ABNORMAL HIGH (ref 4.8–5.6)
Mean Plasma Glucose: 180.03 mg/dL

## 2020-02-01 LAB — CBG MONITORING, ED
Glucose-Capillary: 251 mg/dL — ABNORMAL HIGH (ref 70–99)
Glucose-Capillary: 263 mg/dL — ABNORMAL HIGH (ref 70–99)

## 2020-02-01 LAB — MAGNESIUM: Magnesium: 2.4 mg/dL (ref 1.7–2.4)

## 2020-02-01 LAB — FERRITIN: Ferritin: 849 ng/mL — ABNORMAL HIGH (ref 24–336)

## 2020-02-01 MED ORDER — SODIUM CHLORIDE 0.9 % IV SOLN
100.0000 mg | Freq: Every day | INTRAVENOUS | Status: AC
  Administered 2020-02-02 – 2020-02-05 (×4): 100 mg via INTRAVENOUS
  Filled 2020-02-01 (×4): qty 20

## 2020-02-01 MED ORDER — SODIUM CHLORIDE 0.9 % IV SOLN
200.0000 mg | Freq: Once | INTRAVENOUS | Status: DC
  Filled 2020-02-01: qty 40

## 2020-02-01 NOTE — ED Notes (Signed)
CBG 256  °

## 2020-02-01 NOTE — TOC Initial Note (Addendum)
Transition of Care [facility]) - Initial/Assessment Note    Patient Details  Name: Andrew Cross MRN: [id number] Date of Birth: 26-Sep-1955  [facility] CM/SW Contact:    Elliot Cousin, RN Phone Number: [phone] 02/01/2020, 3:10 PM  Clinical Narrative:                 TOC CM spoke to pt who wants to leave against medical advise. He has changed his mind and wants to speak to attending. Explained to pt TOC CM cannot arrange oxygen for home without a discharge order.  [employer] will provide oxygen, will have nurse pt in qualifying sats. Spoke to dtr and she will arrange with DME provider delivery of oxygen to home. The dtr will bring his portable with her at time of dc. She and pt's wife will provide transportation home.   Expected Discharge Plan: Home/Self Care Barriers to Discharge: Continued Medical Work up   Patient Goals and CMS Choice Patient states their goals for this hospitalization and ongoing recovery are:: i want to go home CMS Medicare.gov Compare Post Acute Care list provided to:: Patient Choice offered to / list presented to : Patient  Expected Discharge Plan and Services Expected Discharge Plan: Home/Self Care In-house Referral: Clinical Social Work Discharge Planning Services: CM Consult Post Acute Care Choice: Durable Medical Equipment Living arrangements for the past 2 months: Single Family Home                                      Prior Living Arrangements/Services Living arrangements for the past 2 months: Single Family Home Lives with:: Spouse Patient language and need for interpreter reviewed:: Yes        Need for Family Participation in Patient Care: Yes (Comment) Care giver support system in place?: Yes (comment)   Criminal Activity/Legal Involvement Pertinent to Current Situation/Hospitalization: No - Comment as needed  Activities of Daily Living      Permission Sought/Granted Permission sought to share information with  : Case Manager,PCP,Family Supports Permission granted to share information with : Yes, Verbal Permission Granted  Share Information with NAME: Brevan Luberto, Purvis Kilts  Permission granted to share info w AGENCY: DME  Permission granted to share info w Relationship: wife, daughter  Permission granted to share info w Contact Information: [phone], [phone]  Emotional Assessment       Orientation: : Oriented to Self,Oriented to Place,Oriented to  Time,Oriented to Situation   Psych Involvement: No (comment)  Admission diagnosis:  Acute hypoxemic respiratory failure due to COVID-19 (HCC) [U07.1, J96.01] Patient Active Problem List   Diagnosis Date Noted  . Acute hypoxemic respiratory failure due to COVID-19 (HCC) 01/31/2020  . Hyperlipidemia associated with type 2 diabetes mellitus (HCC) 01/31/2020  . Diabetes mellitus type 2 with complications (HCC)   . HLD (hyperlipidemia)   . Hypertension associated with diabetes (HCC)   . H/O: gout    PCP:  Donita Brooks, MD Pharmacy:   [facility] [phone] Nicholes Rough, [facility] - [address] [place] [address] Phone: [phone] Fax: [phone]     Social Determinants of Health (SDOH) Interventions    Readmission Risk Interventions No flowsheet data found.

## 2020-02-01 NOTE — ED Notes (Signed)
SATURATION QUALIFICATIONS: (This note is used to comply with regulatory documentation for home oxygen)  Patient Saturations on Room Air at Rest = 82%   Patient Saturations on 4 Liters of oxygen while Ambulating = 91%   Please briefly explain why patient needs home oxygen: COVID

## 2020-02-01 NOTE — Plan of Care (Signed)
  Problem: Coping: Goal: Psychosocial and spiritual needs will be supported Outcome: Progressing   

## 2020-02-01 NOTE — Progress Notes (Signed)
PROGRESS NOTE    Andrew Cross  ZOX:[id number] DOB: 12/01/1955 DOA: 01/31/2020 PCP: Donita BrooksPickard, Warren T, MD    Chief Complaint  Patient presents with  . Covid Positive    Low sats     Brief Narrative:  Andrew Cross is a 64 y.o. male with medical history significant for type 2 diabetes, hypertension, hyperlipidemia who presents to the ED from the infusion center for evaluation of hypoxia in the setting of COVID-19 infection.  Subjective:  Reports feeling tight in the chest with dry cough On 6liter oxygen Wife at bedside, patient now wants to have remdisivir   Assessment & Plan:   Principal Problem:   Acute hypoxemic respiratory failure due to COVID-19 [facility](HCC) Active Problems:   Hypertension associated with diabetes (HCC)   Diabetes mellitus type 2 with complications (HCC)   Hyperlipidemia associated with type 2 diabetes mellitus (HCC)    COVID-19 positive test (U07.1, COVID-19) with Acute Pneumonia (J12.89, Other viral pneumonia) Acute hypoxic respiratory failure -CTA showed patchy pneumonia, no PE -Continue oxygen supplement, IV steroid, patient initially refused remdesivir, now agreed to proceed, he understand the risk and benefit, continue monitor inflammatory markers, monitor O2 requirement   Noninsulin-dependent type 2 diabetes -Home medication Metformin held, expect elevated blood glucose while on steroid -On sliding scale insulin in hospital, adjust insulin dose as needed  Hypertension Continue home medication Toprol-XL, ramipril,, Norvasc -He report does not take HCTZ at home -Expect elevated blood pressure due to steroid use, will adjust blood pressure medication as needed   Hyperlipidemia continue statin  Body mass index is 35.7 kg/m.     DVT prophylaxis: enoxaparin (LOVENOX) injection 40 mg Start: 01/31/20 2200   Code Status: Full Family Communication: Daughter over the phone, wife at bedside Disposition:   Status is: Inpatient  Dispo:  The patient is from: Home              Anticipated d/c is to: Home              Anticipated d/c date is: When able to wean down oxygen and inflammatory marker trending down                 Objective: Vitals:   02/01/20 1300 02/01/20 1402 02/01/20 1500 02/01/20 1618  BP: (!) 145/85   (!) 170/87  Pulse: 97 88  87  Resp: 19   (!) 22  Temp:    98.7 F (37.1 C)  TempSrc:      SpO2: 92% 90% 92% 91%   No intake or output data in the 24 hours ending 02/01/20 1751 There were no vitals filed for this visit.  Examination:  General exam: calm, NAD Respiratory system: Slightly diminished, no wheezing, no rales, no rhonchi. Respiratory effort normal. Cardiovascular system: S1 & S2 heard, RRR. No JVD, no murmur, No pedal edema. Gastrointestinal system: Abdomen is nondistended, soft and nontender. Normal bowel sounds heard. Central nervous system: Alert and oriented. No focal neurological deficits. Extremities: Symmetric 5 x 5 power. Skin: No rashes, lesions or ulcers Psychiatry: Judgement and insight appear normal. Mood & affect appropriate.     Data Reviewed: I have personally reviewed following labs and imaging studies  CBC: Recent Labs  Lab 01/28/20 1951 01/31/20 1437 02/01/20 0556  WBC 6.4 6.8 6.0  NEUTROABS 5.0 6.0 5.0  HGB 16.0 15.5 14.3  HCT 44.0 43.9 40.6  MCV 89.8 92.4 92.5  PLT 150 225 246    Basic Metabolic Panel: Recent Labs  Lab 01/28/20  1951 01/31/20 1437 02/01/20 0556  NA 130* 134* 137  K 4.1 4.3 4.7  CL 95* 99 102  CO2 22 20* 25  GLUCOSE 200* 258* 237*  BUN 16 18 19   CREATININE 1.11 0.97 1.02  CALCIUM 8.5* 8.7* 8.8*  MG  --   --  2.4  PHOS  --   --  4.1    GFR: Estimated Creatinine Clearance: 97.3 mL/min (by C-G formula based on SCr of 1.02 mg/dL).  Liver Function Tests: Recent Labs  Lab 01/28/20 1951 01/31/20 1437 02/01/20 0556  AST 64* 48* 31  ALT 43 51* 46*  ALKPHOS 54 53 47  BILITOT 1.0 1.2 1.2  PROT 6.7 6.5 6.4*  ALBUMIN 3.8 3.5  3.3*    CBG: Recent Labs  Lab 01/31/20 2233 02/01/20 0756 02/01/20 1208 02/01/20 1652  GLUCAP 294* 251* 263* 255*     Recent Results (from the past 240 hour(s))  Blood Culture (routine x 2)     Status: None (Preliminary result)   Collection Time: 01/31/20  3:00 PM   Specimen: BLOOD  Result Value Ref Range Status   Specimen Description   Final    BLOOD RIGHT ANTECUBITAL Performed at  [facility], [address], [place], [place]    Special Requests   Final    BOTTLES DRAWN AEROBIC AND ANAEROBIC Blood Culture adequate volume Performed at [facility], [address] [place], [place]    Culture   Final    NO GROWTH < 12 HOURS Performed at [facility], [address] [place], [place]    Report Status PENDING  Incomplete  Blood Culture (routine x 2)     Status: None (Preliminary result)   Collection Time: 01/31/20  3:00 PM   Specimen: BLOOD  Result Value Ref Range Status   Specimen Description   Final    BLOOD LEFT ANTECUBITAL Performed at [facility], [address] [place], [place]    Special Requests   Final    BOTTLES DRAWN AEROBIC AND ANAEROBIC Blood Culture adequate volume Performed at [facility], [address], [place], [place]    Culture   Final    NO GROWTH < 12 HOURS Performed at [facility], [address] [place], [place]    Report Status PENDING  Incomplete  Resp Panel by RT-PCR (Flu A&B, Covid) Nasopharyngeal Swab     Status: Abnormal   Collection Time: 01/31/20  3:50 PM   Specimen: Nasopharyngeal Swab; Nasopharyngeal(NP) swabs in vial transport medium  Result Value Ref Range Status   SARS Coronavirus 2 by RT PCR POSITIVE (A) NEGATIVE Final    Comment: RESULT CALLED TO, READ BACK BY AND VERIFIED WITH: M.VENTER, PA AT 1800 ON 01/31/20 BY N.THOMPSON (NOTE) SARS-CoV-2 target nucleic acids are  DETECTED.  The SARS-CoV-2 RNA is generally detectable in upper respiratory specimens during the acute phase of infection. Positive results are indicative of the presence of the identified virus, but do not rule out bacterial infection or co-infection with other pathogens not detected by the test. Clinical correlation with patient history and other diagnostic information is necessary to determine patient infection status. The expected result is Negative.  Fact Sheet for Patients: 02/02/20  Fact Sheet for Healthcare Providers: BloggerCourse.com  This test is not yet approved or cleared by the [place] FDA and  has been authorized for detection and/or diagnosis of SARS-CoV-2 by  FDA under an Emergency Use Authorization (EUA).  This EUA will remain in effect (meaning thi s test can be used) for the duration of  the COVID-19 declaration under Section 564(b)(1) of the Act, 21 U.S.C. section 360bbb-3(b)(1), unless the authorization is terminated or revoked sooner.     Influenza A by PCR NEGATIVE NEGATIVE Final   Influenza B by PCR NEGATIVE NEGATIVE Final    Comment: (NOTE) The Xpert Xpress SARS-CoV-2/FLU/RSV plus assay is intended as an aid in the diagnosis of influenza from Nasopharyngeal swab specimens and should not be used as a sole basis for treatment. Nasal washings and aspirates are unacceptable for Xpert Xpress SARS-CoV-2/FLU/RSV testing.  Fact Sheet for Patients: BloggerCourse.com  Fact Sheet for Healthcare Providers: SeriousBroker.it  This test is not yet approved or cleared by the [place] FDA and has been authorized for detection and/or diagnosis of SARS-CoV-2 by FDA under an Emergency Use Authorization (EUA). This EUA will remain in effect (meaning this test can be used) for the duration of the COVID-19 declaration under Section 564(b)(1) of the Act, 21  U.S.C. section 360bbb-3(b)(1), unless the authorization is terminated or revoked.  Performed at [facility], [address], [place], [place] [address]          Radiology Studies: [facility] Chest Port 1 View  Result Date: 01/31/2020 CLINICAL DATA:  COVID.  Decreased oxygen saturation. EXAM: PORTABLE CHEST 1 VIEW COMPARISON:  Chest CTA 3 days ago. FINDINGS: Upper normal heart size with normal mediastinal contours. Patchy heterogeneous bilateral airspace opacities in a mid-lower lung zone predominant distribution. There may be slight improvement from recent CT. No evidence of pneumomediastinum. No pneumothorax. No pleural fluid. Stable osseous structures. IMPRESSION: Patchy heterogeneous bilateral airspace opacities in a mid-lower lung zone predominant distribution consistent with COVID pneumonia. There may be slight improvement from recent CT. Electronically Signed   By: Narda Rutherford M.D.   On: 01/31/2020 15:32        Scheduled Meds: . amLODipine  10 mg Oral Daily  . vitamin C  500 mg Oral Daily  . enoxaparin (LOVENOX) injection  40 mg Subcutaneous Q24H  . hydrochlorothiazide  25 mg Oral Daily  . insulin aspart  0-15 Units Subcutaneous TID WC  . insulin aspart  0-5 Units Subcutaneous QHS  . methylPREDNISolone (SOLU-MEDROL) injection  40 mg Intravenous Q12H  . metoprolol succinate  50 mg Oral Daily  . ramipril  5 mg Oral BID  . rosuvastatin  10 mg Oral Daily  . zinc sulfate  220 mg Oral Daily   Continuous Infusions:   LOS: 1 day   Time spent: Greater than 50% of this time was spent in counseling, explanation of diagnosis, planning of further management, and coordination of care.  I have personally reviewed and interpreted on  02/01/2020 daily labs,  imagings as discussed above under date review session and assessment and plans.  I reviewed all nursing notes, pharmacy notes,  vitals, pertinent old records  I have discussed plan of care as  described above with RN , patient and family on 02/01/2020  Voice Recognition /Dragon dictation system was used to create this note, attempts have been made to correct errors. Please contact the author with questions and/or clarifications.   Albertine Grates, MD PhD FACP [facility]  Available via Epic secure chat 7am-7pm for nonurgent issues Please page for urgent issues To page the attending provider between 7A-7P or the covering provider during after hours 7P-7A, please log into the web site  [email or web address] and access using universal [place] password for that web site. If you do not have the password, please call the hospital operator.    02/01/2020, 5:51 PM

## 2020-02-02 DIAGNOSIS — J9601 Acute respiratory failure with hypoxia: Secondary | ICD-10-CM | POA: Diagnosis not present

## 2020-02-02 DIAGNOSIS — U071 COVID-19: Secondary | ICD-10-CM | POA: Diagnosis not present

## 2020-02-02 LAB — CBC WITH DIFFERENTIAL/PLATELET
Abs Immature Granulocytes: 0.08 10*3/uL — ABNORMAL HIGH (ref 0.00–0.07)
Basophils Absolute: 0 10*3/uL (ref 0.0–0.1)
Basophils Relative: 0 %
Eosinophils Absolute: 0 10*3/uL (ref 0.0–0.5)
Eosinophils Relative: 0 %
HCT: 40.4 % (ref 39.0–52.0)
Hemoglobin: 14.3 g/dL (ref 13.0–17.0)
Immature Granulocytes: 1 %
Lymphocytes Relative: 6 %
Lymphs Abs: 0.5 10*3/uL — ABNORMAL LOW (ref 0.7–4.0)
MCH: 32.9 pg (ref 26.0–34.0)
MCHC: 35.4 g/dL (ref 30.0–36.0)
MCV: 93.1 fL (ref 80.0–100.0)
Monocytes Absolute: 0.4 10*3/uL (ref 0.1–1.0)
Monocytes Relative: 4 %
Neutro Abs: 7.9 10*3/uL — ABNORMAL HIGH (ref 1.7–7.7)
Neutrophils Relative %: 89 %
Platelets: 246 10*3/uL (ref 150–400)
RBC: 4.34 MIL/uL (ref 4.22–5.81)
RDW: 11.3 % — ABNORMAL LOW (ref 11.5–15.5)
WBC: 8.8 10*3/uL (ref 4.0–10.5)
nRBC: 0 % (ref 0.0–0.2)

## 2020-02-02 LAB — COMPREHENSIVE METABOLIC PANEL
ALT: 43 U/L (ref 0–44)
AST: 32 U/L (ref 15–41)
Albumin: 3.1 g/dL — ABNORMAL LOW (ref 3.5–5.0)
Alkaline Phosphatase: 48 U/L (ref 38–126)
Anion gap: 9 (ref 5–15)
BUN: 20 mg/dL (ref 8–23)
CO2: 24 mmol/L (ref 22–32)
Calcium: 8.7 mg/dL — ABNORMAL LOW (ref 8.9–10.3)
Chloride: 102 mmol/L (ref 98–111)
Creatinine, Ser: 0.96 mg/dL (ref 0.61–1.24)
GFR, Estimated: >60 mL/min (ref >60)
Glucose, Bld: 223 mg/dL — ABNORMAL HIGH (ref 70–99)
Potassium: 4.7 mmol/L (ref 3.5–5.1)
Sodium: 135 mmol/L (ref 135–145)
Total Bilirubin: 1 mg/dL (ref 0.3–1.2)
Total Protein: 5.9 g/dL — ABNORMAL LOW (ref 6.5–8.1)

## 2020-02-02 LAB — GLUCOSE, CAPILLARY
Glucose-Capillary: 227 mg/dL — ABNORMAL HIGH (ref 70–99)
Glucose-Capillary: 217 mg/dL — ABNORMAL HIGH (ref 70–99)
Glucose-Capillary: 296 mg/dL — ABNORMAL HIGH (ref 70–99)
Glucose-Capillary: 213 mg/dL — ABNORMAL HIGH (ref 70–99)

## 2020-02-02 LAB — MAGNESIUM: Magnesium: 2.2 mg/dL (ref 1.7–2.4)

## 2020-02-02 LAB — D-DIMER, QUANTITATIVE: D-Dimer, Quant: 0.5 ug/mL-FEU (ref 0.00–0.50)

## 2020-02-02 LAB — PHOSPHORUS: Phosphorus: 3.5 mg/dL (ref 2.5–4.6)

## 2020-02-02 LAB — FERRITIN: Ferritin: 832 ng/mL — ABNORMAL HIGH (ref 24–336)

## 2020-02-02 LAB — HIV ANTIBODY (ROUTINE TESTING W REFLEX): HIV Screen 4th Generation wRfx: NONREACTIVE

## 2020-02-02 LAB — C-REACTIVE PROTEIN: CRP: 2.3 mg/dL — ABNORMAL HIGH (ref <1.0)

## 2020-02-02 MED ORDER — VITAMIN D 25 MCG (1000 UNIT) PO TABS
1000.0000 [IU] | ORAL_TABLET | Freq: Every day | ORAL | Status: DC
  Administered 2020-02-02 – 2020-02-07 (×6): 1000 [IU] via ORAL
  Filled 2020-02-02 (×6): qty 1

## 2020-02-02 NOTE — Progress Notes (Signed)
Andrew NOTE    GRANVEL Cross  XBJ:[id number] DOB: [birth year] DOA: 01/31/2020 PCP: Donita Brooks, MD    Chief Complaint  Patient presents with  . Covid Positive    Low sats     Brief Narrative:  VAN SEYMORE is a 65 y.o. male with medical history significant for type 2 diabetes, hypertension, hyperlipidemia who presents to the ED from the infusion center for evaluation of hypoxia in the setting of COVID-19 infection.  Subjective:  Reports the  Chest tightness appear has resolved,  currently on 4 liter oxygen, o2 range from 87-90% at rest, reports o2 sats dropped to 79% with minimal exertion  , he has been using incentive spirometer, he has not got up walk around yet No fever  Assessment & Plan:   Principal Problem:   Acute hypoxemic respiratory failure due to COVID-19 [facility]) Active Problems:   Hypertension associated with diabetes (HCC)   Diabetes mellitus type 2 with complications (HCC)   Hyperlipidemia associated with type 2 diabetes mellitus (HCC)    COVID-19 positive test (U07.1, COVID-19) with Acute Pneumonia (J12.89, Other viral pneumonia) Acute hypoxic respiratory failure -CTA showed patchy pneumonia, no PE -Continue oxygen supplement, IV steroid, patient initially refused remdesivir, now agreed to proceed, he understands the risk and benefit -Appear improving on current treatment , will continue ,  monitor inflammatory markers, monitor O2 requirement  Transaminitis, likely due to Covid infection Improving  Noninsulin-dependent type 2 diabetes -Home medication Metformin held, expect elevated blood glucose while on steroid -On sliding scale insulin in hospital, adjust insulin dose as needed  Hypertension Continue home medication Toprol-XL, ramipril,, Norvasc -He report does not take HCTZ at home -Expect elevated blood pressure due to steroid use, will adjust blood pressure medication as needed   Hyperlipidemia, report PCP prescribed statin, but  he does not think he has collateral problem, he prefers not to take it.  Body mass index is 35.7 kg/m.     DVT prophylaxis: enoxaparin (LOVENOX) injection 40 mg Start: 01/31/20 2200   Code Status: Full Family Communication: Daughter over the phone on 12/31, wife at bedside on 12/31 Disposition:   Status is: Inpatient  Dispo: The patient is from: Home              Anticipated d/c is to: Home              Anticipated d/c date is: When able to wean down oxygen and inflammatory marker trending down                 Objective: Vitals:   02/01/20 1826 02/01/20 2244 02/02/20 0311 02/02/20 0540  BP: (!) 170/87 135/83 (!) 157/76 (!) 157/82  Pulse: 87 79 63 64  Resp: (!) 22 20 20 20   Temp: 98.7 F (37.1 C) 98.3 F (36.8 C) 98.3 F (36.8 C) 98.1 F (36.7 C)  TempSrc:  Oral Oral Oral  SpO2:  (!) 89% 93% 95%  Weight: 116.1 kg     Height: 5\' 11"  (1.803 m)       Intake/Output Summary (Last 24 hours) at 02/02/2020 1130 Last data filed at 02/02/2020 0900 Gross per 24 hour  Intake 956 ml  Output 475 ml  Net 481 ml   Filed Weights   02/01/20 1826  Weight: 116.1 kg    Examination:  General exam: calm, NAD Respiratory system: Slightly diminished, no wheezing, no rales, no rhonchi. Respiratory effort normal. Cardiovascular system: S1 & S2 heard, RRR. No JVD, no murmur, No  pedal edema. Gastrointestinal system: Abdomen is nondistended, soft and nontender. Normal bowel sounds heard. Central nervous system: Alert and oriented. No focal neurological deficits. Extremities: Symmetric 5 x 5 power. Skin: No rashes, lesions or ulcers Psychiatry: Judgement and insight appear normal. Mood & affect appropriate.     Data Reviewed: I have personally reviewed following labs and imaging studies  CBC: Recent Labs  Lab 01/28/20 1951 01/31/20 1437 02/01/20 0556 02/02/20 0405  WBC 6.4 6.8 6.0 8.8  NEUTROABS 5.0 6.0 5.0 7.9*  HGB 16.0 15.5 14.3 14.3  HCT 44.0 43.9 40.6 40.4  MCV 89.8  92.4 92.5 93.1  PLT 150 225 246 595    Basic Metabolic Panel: Recent Labs  Lab 01/28/20 1951 01/31/20 1437 02/01/20 0556 02/02/20 0405  NA 130* 134* 137 135  K 4.1 4.3 4.7 4.7  CL 95* 99 102 102  CO2 22 20* 25 24  GLUCOSE 200* 258* 237* 223*  BUN 16 18 19 20   CREATININE 1.11 0.97 1.02 0.96  CALCIUM 8.5* 8.7* 8.8* 8.7*  MG  --   --  2.4 2.2  PHOS  --   --  4.1 3.5    GFR: Estimated Creatinine Clearance: 103.4 mL/min (by C-G formula based on SCr of 0.96 mg/dL).  Liver Function Tests: Recent Labs  Lab 01/28/20 1951 01/31/20 1437 02/01/20 0556 02/02/20 0405  AST 64* 48* 31 32  ALT 43 51* 46* 43  ALKPHOS 54 53 47 48  BILITOT 1.0 1.2 1.2 1.0  PROT 6.7 6.5 6.4* 5.9*  ALBUMIN 3.8 3.5 3.3* 3.1*    CBG: Recent Labs  Lab 02/01/20 1208 02/01/20 1652 02/01/20 2246 02/02/20 0731 02/02/20 1126  GLUCAP 263* 255* 202* 227* 217*     Recent Results (from the past 240 hour(s))  Blood Culture (routine x 2)     Status: None (Preliminary result)   Collection Time: 01/31/20  3:00 PM   Specimen: BLOOD  Result Value Ref Range Status   Specimen Description   Final    BLOOD RIGHT ANTECUBITAL Performed at [facility], [place] [address], [place], [place] [address]    Special Requests   Final    BOTTLES DRAWN AEROBIC AND ANAEROBIC Blood Culture adequate volume Performed at [facility] [address] [place], [place] [address]    Culture   Final    NO GROWTH 2 DAYS Performed at [facility] [address] [place], [place] [address]    Report Status PENDING  Incomplete  Blood Culture (routine x 2)     Status: None (Preliminary result)   Collection Time: 01/31/20  3:00 PM   Specimen: BLOOD  Result Value Ref Range Status   Specimen Description   Final    BLOOD LEFT ANTECUBITAL Performed at [facility] [address], [place], [place] [address]    Special Requests   Final    BOTTLES DRAWN AEROBIC AND  ANAEROBIC Blood Culture adequate volume Performed at [facility] [address], [place], [place] [address]    Culture   Final    NO GROWTH 2 DAYS Performed at [facility] [address] [place], [place] [address]    Report Status PENDING  Incomplete  Resp Panel by RT-PCR (Flu A&B, Covid) Nasopharyngeal Swab     Status: Abnormal   Collection Time: 01/31/20  3:50 PM   Specimen: Nasopharyngeal Swab; Nasopharyngeal(NP) swabs in vial transport medium  Result Value Ref Range Status  SARS Coronavirus 2 by RT PCR POSITIVE (A) NEGATIVE Final    Comment: RESULT CALLED TO, READ BACK BY AND VERIFIED WITH: M.VENTER, PA AT 1800 ON 01/31/20 BY N.THOMPSON (NOTE) SARS-CoV-2 target nucleic acids are DETECTED.  The SARS-CoV-2 RNA is generally detectable in upper respiratory specimens during the acute phase of infection. Positive results are indicative of the presence of the identified virus, but do not rule out bacterial infection or co-infection with other pathogens not detected by the test. Clinical correlation with patient history and other diagnostic information is necessary to determine patient infection status. The expected result is Negative.  Fact Sheet for Patients: BloggerCourse.com  Fact Sheet for Healthcare Providers: SeriousBroker.it  This test is not yet approved or cleared by the [place] FDA and  has been authorized for detection and/or diagnosis of SARS-CoV-2 by FDA under an Emergency Use Authorization (EUA).  This EUA will remain in effect (meaning thi s test can be used) for the duration of  the COVID-19 declaration under Section 564(b)(1) of the Act, 21 U.S.C. section 360bbb-3(b)(1), unless the authorization is terminated or revoked sooner.     Influenza A by PCR NEGATIVE NEGATIVE Final   Influenza B by PCR NEGATIVE NEGATIVE Final    Comment: (NOTE) The Xpert Xpress SARS-CoV-2/FLU/RSV  plus assay is intended as an aid in the diagnosis of influenza from Nasopharyngeal swab specimens and should not be used as a sole basis for treatment. Nasal washings and aspirates are unacceptable for Xpert Xpress SARS-CoV-2/FLU/RSV testing.  Fact Sheet for Patients: BloggerCourse.com  Fact Sheet for Healthcare Providers: SeriousBroker.it  This test is not yet approved or cleared by the [place] FDA and has been authorized for detection and/or diagnosis of SARS-CoV-2 by FDA under an Emergency Use Authorization (EUA). This EUA will remain in effect (meaning this test can be used) for the duration of the COVID-19 declaration under Section 564(b)(1) of the Act, 21 U.S.C. section 360bbb-3(b)(1), unless the authorization is terminated or revoked.  Performed at [facility], [address] [place], [place] [address]          Radiology Studies: [facility] Chest Port 1 View  Result Date: 01/31/2020 CLINICAL DATA:  COVID.  Decreased oxygen saturation. EXAM: PORTABLE CHEST 1 VIEW COMPARISON:  Chest CTA 3 days ago. FINDINGS: Upper normal heart size with normal mediastinal contours. Patchy heterogeneous bilateral airspace opacities in a mid-lower lung zone predominant distribution. There may be slight improvement from recent CT. No evidence of pneumomediastinum. No pneumothorax. No pleural fluid. Stable osseous structures. IMPRESSION: Patchy heterogeneous bilateral airspace opacities in a mid-lower lung zone predominant distribution consistent with COVID pneumonia. There may be slight improvement from recent CT. Electronically Signed   By: Narda Rutherford M.D.   On: 01/31/2020 15:32        Scheduled Meds: . amLODipine  10 mg Oral Daily  . vitamin C  500 mg Oral Daily  . cholecalciferol  1,000 Units Oral Daily  . enoxaparin (LOVENOX) injection  40 mg Subcutaneous Q24H  . insulin aspart  0-15 Units Subcutaneous TID WC   . insulin aspart  0-5 Units Subcutaneous QHS  . methylPREDNISolone (SOLU-MEDROL) injection  40 mg Intravenous Q12H  . metoprolol succinate  50 mg Oral Daily  . ramipril  5 mg Oral BID  . zinc sulfate  220 mg Oral Daily   Continuous Infusions: . remdesivir 200 mg in sodium chloride 0.9% 250 mL IVPB     Followed by  . remdesivir 100 mg in NS  100 mL 100 mg (02/02/20 0911)     LOS: 2 days   Time spent: Greater than 50% of this time was spent in counseling, explanation of diagnosis, planning of further management, and coordination of care.  I have personally reviewed and interpreted on  02/02/2020 daily labs,  imagings as discussed above under date review session and assessment and plans.  I reviewed all nursing notes, pharmacy notes,  vitals, pertinent old records  I have discussed plan of care as described above with RN , patient  on 02/02/2020  Voice Recognition /Dragon dictation system was used to create this note, attempts have been made to correct errors. Please contact the author with questions and/or clarifications.   Albertine Grates, MD PhD FACP [facility]  Available via Epic secure chat 7am-7pm for nonurgent issues Please page for urgent issues To page the attending provider between 7A-7P or the covering provider during after hours 7P-7A, please log into the web site [email or web address] and access using universal [place] password for that web site. If you do not have the password, please call the hospital operator.    02/02/2020, 11:30 AM

## 2020-02-02 NOTE — Progress Notes (Signed)
Patient ambulated in Room today. Sats on RA during ambulation ranged from 78-83%.  Pt denies feeling SOB or light headed.  Oxygen placed on at 3L [place].  Oxygen sats from 84-87%.  Continuously monitoring patient and adjusting O2 as needed to get sats to 92% or greater per MD orders.  Currently patient on 5L [place] with humidifier; Sats 87% and patient is resting in bed.  Will continue to monitor.

## 2020-02-02 NOTE — Plan of Care (Signed)
  Problem: Coping: Goal: Psychosocial and spiritual needs will be supported Outcome: Progressing   

## 2020-02-03 DIAGNOSIS — J9601 Acute respiratory failure with hypoxia: Secondary | ICD-10-CM | POA: Diagnosis not present

## 2020-02-03 DIAGNOSIS — U071 COVID-19: Secondary | ICD-10-CM | POA: Diagnosis not present

## 2020-02-03 LAB — CBC WITH DIFFERENTIAL/PLATELET
Abs Immature Granulocytes: 0.14 10*3/uL — ABNORMAL HIGH (ref 0.00–0.07)
Basophils Absolute: 0 10*3/uL (ref 0.0–0.1)
Basophils Relative: 0 %
Eosinophils Absolute: 0 10*3/uL (ref 0.0–0.5)
Eosinophils Relative: 0 %
HCT: 41.7 % (ref 39.0–52.0)
Hemoglobin: 14.6 g/dL (ref 13.0–17.0)
Immature Granulocytes: 2 %
Lymphocytes Relative: 5 %
Lymphs Abs: 0.5 10*3/uL — ABNORMAL LOW (ref 0.7–4.0)
MCH: 33.2 pg (ref 26.0–34.0)
MCHC: 35 g/dL (ref 30.0–36.0)
MCV: 94.8 fL (ref 80.0–100.0)
Monocytes Absolute: 0.4 10*3/uL (ref 0.1–1.0)
Monocytes Relative: 4 %
Neutro Abs: 8.5 10*3/uL — ABNORMAL HIGH (ref 1.7–7.7)
Neutrophils Relative %: 89 %
Platelets: 267 10*3/uL (ref 150–400)
RBC: 4.4 MIL/uL (ref 4.22–5.81)
RDW: 11.3 % — ABNORMAL LOW (ref 11.5–15.5)
WBC: 9.6 10*3/uL (ref 4.0–10.5)
nRBC: 0 % (ref 0.0–0.2)

## 2020-02-03 LAB — GLUCOSE, CAPILLARY
Glucose-Capillary: 246 mg/dL — ABNORMAL HIGH (ref 70–99)
Glucose-Capillary: 256 mg/dL — ABNORMAL HIGH (ref 70–99)
Glucose-Capillary: 267 mg/dL — ABNORMAL HIGH (ref 70–99)
Glucose-Capillary: 207 mg/dL — ABNORMAL HIGH (ref 70–99)

## 2020-02-03 LAB — COMPREHENSIVE METABOLIC PANEL
ALT: 41 U/L (ref 0–44)
AST: 25 U/L (ref 15–41)
Albumin: 3.1 g/dL — ABNORMAL LOW (ref 3.5–5.0)
Alkaline Phosphatase: 50 U/L (ref 38–126)
Anion gap: 8 (ref 5–15)
BUN: 22 mg/dL (ref 8–23)
CO2: 25 mmol/L (ref 22–32)
Calcium: 8.7 mg/dL — ABNORMAL LOW (ref 8.9–10.3)
Chloride: 101 mmol/L (ref 98–111)
Creatinine, Ser: 1.07 mg/dL (ref 0.61–1.24)
GFR, Estimated: >60 mL/min (ref >60)
Glucose, Bld: 250 mg/dL — ABNORMAL HIGH (ref 70–99)
Potassium: 5.5 mmol/L — ABNORMAL HIGH (ref 3.5–5.1)
Sodium: 134 mmol/L — ABNORMAL LOW (ref 135–145)
Total Bilirubin: 0.8 mg/dL (ref 0.3–1.2)
Total Protein: 5.8 g/dL — ABNORMAL LOW (ref 6.5–8.1)

## 2020-02-03 LAB — C-REACTIVE PROTEIN: CRP: 2 mg/dL — ABNORMAL HIGH (ref <1.0)

## 2020-02-03 LAB — MAGNESIUM: Magnesium: 2.1 mg/dL (ref 1.7–2.4)

## 2020-02-03 LAB — PHOSPHORUS: Phosphorus: 4.1 mg/dL (ref 2.5–4.6)

## 2020-02-03 LAB — D-DIMER, QUANTITATIVE: D-Dimer, Quant: 0.43 ug/mL-FEU (ref 0.00–0.50)

## 2020-02-03 LAB — FERRITIN: Ferritin: 705 ng/mL — ABNORMAL HIGH (ref 24–336)

## 2020-02-03 MED ORDER — INSULIN GLARGINE 100 UNIT/ML ~~LOC~~ SOLN
5.0000 [IU] | Freq: Every day | SUBCUTANEOUS | Status: DC
  Administered 2020-02-03 – 2020-02-05 (×3): 5 [IU] via SUBCUTANEOUS
  Filled 2020-02-03 (×3): qty 0.05

## 2020-02-03 MED ORDER — ALUM & MAG HYDROXIDE-SIMETH 200-200-20 MG/5ML PO SUSP
30.0000 mL | Freq: Four times a day (QID) | ORAL | Status: DC | PRN
  Administered 2020-02-03 – 2020-02-05 (×2): 30 mL via ORAL
  Filled 2020-02-03 (×3): qty 30

## 2020-02-03 NOTE — Progress Notes (Signed)
PROGRESS NOTE  Andrew Cross  DOB: [birth year]  PCP: Susy Frizzle, MD QVZ:[id number]  DOA: 01/31/2020  LOS: 3 days   Chief Complaint  Patient presents with  . Covid Positive    Low sats    Brief narrative: Andrew Cross is a 65 y.o. male with PMH significant for T2DM, HTN, HLD Patient tested positive for COVID-19 as an outpatient.  On 12/30, he presented at infusion center.  Monoclonal antibody.  He was noted to be hypoxic and hence sent to the ED. Admitted for further evaluation management.  Subjective: Patient was seen and examined this morning. Pleasant middle-aged Caucasian male.  On 4 L oxygen by nasal cannula. Feels improvement.  Assessment/Plan: COVID pneumonia Acute respiratory failure with hypoxia  -Presented with shortness of breath, hypoxia -COVID test: Positive as an outpatient -CTA chest on admission showed patchy pneumonia  -Treatment: Patient initially refused remdesivir but currently on it, to complete a 5-day course on 1/4.  Also on Solu-Medrol 40 mg twice daily. -Progression: Feels clinically improved.  Currently requiring 4 L oxygen by nasal cannula.  And with ambulation -Supportive care: Vitamin C, Zinc, PRN inhalers, Tylenol, Antitussives (benzonatate/ Mucinex/Tussionex).   -Encouraged incentive spirometry, prone position, out of bed and early mobilization as much as possible -Continue airborne/contact isolation precautions for duration of 3 weeks from the day of diagnosis. -WBC and inflammatory markers trend as below.  Overall improving.  Recent Labs  Lab 01/28/20 1951 01/31/20 1437 01/31/20 1550 01/31/20 1745 02/01/20 0556 02/02/20 0405 02/03/20 0333  SARSCOV2NAA  --   --  POSITIVE*  --   --   --   --   WBC 6.4 6.8  --   --  6.0 8.8 9.6  LATICACIDVEN  --  1.8  --  1.8  --   --   --   PROCALCITON  --  <0.10  --   --   --   --   --   DDIMER  --  0.54*  --   --   --  0.50 0.43  FERRITIN  --  1,067*  --   --  849* 832* 705*  LDH  --   481*  --   --   --   --   --   CRP  --  5.3*  --   --  4.7* 2.3* 2.0*  ALT 43 51*  --   --  46* 43 41   Transaminitis  -likely due to Covid infection -Improved Recent Labs  Lab 01/28/20 1951 01/31/20 1437 02/01/20 0556 02/02/20 0405 02/03/20 0333  AST 64* 48* 31 32 25  ALT 43 51* 46* 43 41  ALKPHOS 54 53 47 48 50  BILITOT 1.0 1.2 1.2 1.0 0.8  PROT 6.7 6.5 6.4* 5.9* 5.8*  ALBUMIN 3.8 3.5 3.3* 3.1* 3.1*   Type 2 diabetes mellitus -A1c 7.9 on 12/31. -On Metformin at home which is currently on hold. -Blood sugar seems persistently elevated to 200s on sliding scale insulin.  Will start on Lantus 5 units from tonight. Recent Labs  Lab 02/02/20 1126 02/02/20 1705 02/02/20 2115 02/03/20 0735 02/03/20 1128  GLUCAP 217* 296* 213* 246* 256*   Hypertension -Currently continued home medication Toprol-XL, ramipril,, Norvasc.  Hold ramipril today because of hyperkalemia -Blood pressure elevated in 140s.  Partially secondary to IV steroids.  Continue to monitor.  Hyperlipidemia -Reports PCP prescribed statin, but patient prefers not to take it.  Hyperkalemia -Potassium elevated to 5.5 this morning.  Ramipril held.  Repeat tomorrow. Recent Labs  Lab 01/28/20 1951 01/31/20 1437 02/01/20 0556 02/02/20 0405 02/03/20 0333  K 4.1 4.3 4.7 4.7 5.5*   Mobility: Encourage ambulation Code Status:   Code Status: Full Code  Nutritional status: Body mass index is 35.7 kg/m.     Diet Order            Diet heart healthy/carb modified Room service appropriate? Yes; Fluid consistency: Thin  Diet effective now                 DVT prophylaxis: enoxaparin (LOVENOX) injection 40 mg Start: 01/31/20 2200   Antimicrobials:  None Fluid: None Consultants: None Family Communication:  None  Status is: Inpatient  Remains inpatient appropriate because: Continued oxygen requirement  Dispo: The patient is from: Home              Anticipated d/c is to: Home              Anticipated  d/c date is: 1 to 2 days              Patient currently is not medically stable to d/c.       Infusions:  . remdesivir 200 mg in sodium chloride 0.9% 250 mL IVPB     Followed by  . remdesivir 100 mg in NS 100 mL 100 mg (02/03/20 1033)    Scheduled Meds: . amLODipine  10 mg Oral Daily  . vitamin C  500 mg Oral Daily  . cholecalciferol  1,000 Units Oral Daily  . enoxaparin (LOVENOX) injection  40 mg Subcutaneous Q24H  . insulin aspart  0-15 Units Subcutaneous TID WC  . insulin aspart  0-5 Units Subcutaneous QHS  . insulin glargine  5 Units Subcutaneous Daily  . methylPREDNISolone (SOLU-MEDROL) injection  40 mg Intravenous Q12H  . metoprolol succinate  50 mg Oral Daily  . zinc sulfate  220 mg Oral Daily    Antimicrobials: Anti-infectives (From admission, onward)   Start     Dose/Rate Route Frequency Ordered Stop   02/02/20 1000  remdesivir 100 mg in sodium chloride 0.9 % 100 mL IVPB       "Followed by" Linked Group Details   100 mg 200 mL/hr over 30 Minutes Intravenous Daily 02/01/20 1756 02/06/20 0959   02/01/20 1830  remdesivir 200 mg in sodium chloride 0.9% 250 mL IVPB       "Followed by" Linked Group Details   200 mg 580 mL/hr over 30 Minutes Intravenous Once 02/01/20 1756     02/01/20 1000  remdesivir 100 mg in sodium chloride 0.9 % 100 mL IVPB  Status:  Discontinued       "Followed by" Linked Group Details   100 mg 200 mL/hr over 30 Minutes Intravenous Daily 01/31/20 1640 01/31/20 1649   01/31/20 1800  remdesivir 200 mg in sodium chloride 0.9% 250 mL IVPB  Status:  Discontinued       "Followed by" Linked Group Details   200 mg 580 mL/hr over 30 Minutes Intravenous Once 01/31/20 1640 01/31/20 1649      PRN meds: acetaminophen, albuterol, chlorpheniramine-HYDROcodone, guaiFENesin-dextromethorphan, ondansetron **OR** ondansetron (ZOFRAN) IV   Objective: Vitals:   02/03/20 1022 02/03/20 1346  BP: 133/77 (!) 144/86  Pulse: 97 88  Resp:  17  Temp:  97.7 F (36.5  C)  SpO2:  (!) 89%    Intake/Output Summary (Last 24 hours) at 02/03/2020 1616 Last data filed at 02/03/2020 0900 Gross per 24 hour  Intake 960 ml  Output 700 ml  Net 260 ml   Filed Weights   02/01/20 1826  Weight: 116.1 kg   Weight change:  Body mass index is 35.7 kg/m.   Physical Exam: General exam: Pleasant, middle-aged Caucasian male.  Not in physical distress Skin: No rashes, lesions or ulcers. HEENT: Atraumatic, normocephalic, no obvious bleeding Lungs: Clear to auscultation bilaterally CVS: Regular rate and rhythm, no murmur GI/Abd soft, nontender, nondistended, bowel sound present CNS: Alert, awake, oriented x3 Psychiatry: Mood appropriate Extremities: No pedal edema, no calf tenderness  Data Review: I have personally reviewed the laboratory data and studies available.  Recent Labs  Lab 01/28/20 1951 01/31/20 1437 02/01/20 0556 02/02/20 0405 02/03/20 0333  WBC 6.4 6.8 6.0 8.8 9.6  NEUTROABS 5.0 6.0 5.0 7.9* 8.5*  HGB 16.0 15.5 14.3 14.3 14.6  HCT 44.0 43.9 40.6 40.4 41.7  MCV 89.8 92.4 92.5 93.1 94.8  PLT 150 225 246 246 267   Recent Labs  Lab 01/28/20 1951 01/31/20 1437 02/01/20 0556 02/02/20 0405 02/03/20 0333  NA 130* 134* 137 135 134*  K 4.1 4.3 4.7 4.7 5.5*  CL 95* 99 102 102 101  CO2 22 20* 25 24 25   GLUCOSE 200* 258* 237* 223* 250*  BUN 16 18 19 20 22   CREATININE 1.11 0.97 1.02 0.96 1.07  CALCIUM 8.5* 8.7* 8.8* 8.7* 8.7*  MG  --   --  2.4 2.2 2.1  PHOS  --   --  4.1 3.5 4.1    F/u labs ordered  Signed, , MD [facility] 02/03/2020

## 2020-02-04 DIAGNOSIS — J9601 Acute respiratory failure with hypoxia: Secondary | ICD-10-CM | POA: Diagnosis not present

## 2020-02-04 DIAGNOSIS — U071 COVID-19: Secondary | ICD-10-CM | POA: Diagnosis not present

## 2020-02-04 LAB — GLUCOSE, CAPILLARY
Glucose-Capillary: 165 mg/dL — ABNORMAL HIGH (ref 70–99)
Glucose-Capillary: 246 mg/dL — ABNORMAL HIGH (ref 70–99)
Glucose-Capillary: 322 mg/dL — ABNORMAL HIGH (ref 70–99)
Glucose-Capillary: 350 mg/dL — ABNORMAL HIGH (ref 70–99)

## 2020-02-04 LAB — COMPREHENSIVE METABOLIC PANEL
ALT: 38 U/L (ref 0–44)
AST: 23 U/L (ref 15–41)
Albumin: 3.1 g/dL — ABNORMAL LOW (ref 3.5–5.0)
Alkaline Phosphatase: 48 U/L (ref 38–126)
Anion gap: 11 (ref 5–15)
BUN: 24 mg/dL — ABNORMAL HIGH (ref 8–23)
CO2: 24 mmol/L (ref 22–32)
Calcium: 8.8 mg/dL — ABNORMAL LOW (ref 8.9–10.3)
Chloride: 100 mmol/L (ref 98–111)
Creatinine, Ser: 1.1 mg/dL (ref 0.61–1.24)
GFR, Estimated: >60 mL/min (ref >60)
Glucose, Bld: 167 mg/dL — ABNORMAL HIGH (ref 70–99)
Potassium: 4.7 mmol/L (ref 3.5–5.1)
Sodium: 135 mmol/L (ref 135–145)
Total Bilirubin: 0.9 mg/dL (ref 0.3–1.2)
Total Protein: 5.9 g/dL — ABNORMAL LOW (ref 6.5–8.1)

## 2020-02-04 LAB — CBC WITH DIFFERENTIAL/PLATELET
Abs Immature Granulocytes: 0.17 10*3/uL — ABNORMAL HIGH (ref 0.00–0.07)
Basophils Absolute: 0 10*3/uL (ref 0.0–0.1)
Basophils Relative: 0 %
Eosinophils Absolute: 0 10*3/uL (ref 0.0–0.5)
Eosinophils Relative: 0 %
HCT: 41.8 % (ref 39.0–52.0)
Hemoglobin: 14.7 g/dL (ref 13.0–17.0)
Immature Granulocytes: 2 %
Lymphocytes Relative: 10 %
Lymphs Abs: 1 10*3/uL (ref 0.7–4.0)
MCH: 33 pg (ref 26.0–34.0)
MCHC: 35.2 g/dL (ref 30.0–36.0)
MCV: 93.7 fL (ref 80.0–100.0)
Monocytes Absolute: 0.4 10*3/uL (ref 0.1–1.0)
Monocytes Relative: 4 %
Neutro Abs: 8.3 10*3/uL — ABNORMAL HIGH (ref 1.7–7.7)
Neutrophils Relative %: 84 %
Platelets: 273 10*3/uL (ref 150–400)
RBC: 4.46 MIL/uL (ref 4.22–5.81)
RDW: 11.4 % — ABNORMAL LOW (ref 11.5–15.5)
WBC: 10 10*3/uL (ref 4.0–10.5)
nRBC: 0 % (ref 0.0–0.2)

## 2020-02-04 LAB — C-REACTIVE PROTEIN: CRP: 3.3 mg/dL — ABNORMAL HIGH (ref <1.0)

## 2020-02-04 LAB — PHOSPHORUS: Phosphorus: 4.1 mg/dL (ref 2.5–4.6)

## 2020-02-04 LAB — D-DIMER, QUANTITATIVE: D-Dimer, Quant: 0.6 ug{FEU}/mL — ABNORMAL HIGH (ref 0.00–0.50)

## 2020-02-04 LAB — MAGNESIUM: Magnesium: 2.2 mg/dL (ref 1.7–2.4)

## 2020-02-04 LAB — FERRITIN: Ferritin: 713 ng/mL — ABNORMAL HIGH (ref 24–336)

## 2020-02-04 MED ORDER — INSULIN ASPART 100 UNIT/ML ~~LOC~~ SOLN
4.0000 [IU] | Freq: Three times a day (TID) | SUBCUTANEOUS | Status: DC
  Administered 2020-02-04 – 2020-02-05 (×3): 4 [IU] via SUBCUTANEOUS

## 2020-02-04 MED ORDER — ENOXAPARIN SODIUM 60 MG/0.6ML ~~LOC~~ SOLN
60.0000 mg | SUBCUTANEOUS | Status: DC
  Administered 2020-02-04 – 2020-02-06 (×3): 60 mg via SUBCUTANEOUS
  Filled 2020-02-04 (×3): qty 0.6

## 2020-02-04 NOTE — Progress Notes (Signed)
PROGRESS NOTE  ATA PECHA  DOB: 20-Sep-1955  PCP: Susy Frizzle, MD WUJ:[id number]  DOA: 01/31/2020  LOS: 4 days   Chief Complaint  Patient presents with  . Covid Positive    Low sats    Brief narrative: Andrew Cross is a 65 y.o. male with PMH significant for T2DM, HTN, HLD Patient tested positive for COVID-19 as an outpatient.  On 12/30, he presented at infusion center.  Monoclonal antibody.  He was noted to be hypoxic and hence sent to the ED. Admitted for further evaluation management.  Subjective: Patient was seen and examined this morning. Propped up in bed.  Not in distress.  Feels better but requiring higher flow of oxygen today at rest at 6-1/2 L/min.  Inflammatory markers are also trending up today.  Assessment/Plan: COVID pneumonia Acute respiratory failure with hypoxia  -Presented with shortness of breath, hypoxia -COVID test: Positive as an outpatient -CTA chest on admission showed patchy pneumonia  -Treatment: Patient initially refused remdesivir but currently on it, to complete a 5-day course on 1/4.  Also on Solu-Medrol 40 mg twice daily.  Continue the same dose today as his inflammatory markers are worsening. -Progression: Feels clinically improved.  Currently requiring more than 6 L oxygen at rest. -Supportive care: Vitamin C, Zinc, PRN inhalers, Tylenol, Antitussives (benzonatate/ Mucinex/Tussionex).   -Encouraged incentive spirometry, prone position, out of bed and early mobilization as much as possible -Continue airborne/contact isolation precautions for duration of 3 weeks from the day of diagnosis. -WBC and inflammatory markers trend as below.  Numbers are overall improving but labs today show higher CRP, ferritin and D-dimer level.  Recent Labs  Lab 01/31/20 1437 01/31/20 1550 01/31/20 1745 02/01/20 0556 02/02/20 0405 02/03/20 0333 02/04/20 0414  SARSCOV2NAA  --  POSITIVE*  --   --   --   --   --   WBC 6.8  --   --  6.0 8.8 9.6 10.0   LATICACIDVEN 1.8  --  1.8  --   --   --   --   PROCALCITON <0.10  --   --   --   --   --   --   DDIMER 0.54*  --   --   --  0.50 0.43 0.60*  FERRITIN 1,067*  --   --  849* 832* 705* 713*  LDH 481*  --   --   --   --   --   --   CRP 5.3*  --   --  4.7* 2.3* 2.0* 3.3*  ALT 51*  --   --  46* 43 41 38   Transaminitis  -likely due to Covid infection -Improved Recent Labs  Lab 01/31/20 1437 02/01/20 0556 02/02/20 0405 02/03/20 0333 02/04/20 0414  AST 48* 31 32 25 23  ALT 51* 46* 43 41 38  ALKPHOS 53 47 48 50 48  BILITOT 1.2 1.2 1.0 0.8 0.9  PROT 6.5 6.4* 5.9* 5.8* 5.9*  ALBUMIN 3.5 3.3* 3.1* 3.1* 3.1*   Type 2 diabetes mellitus -A1c 7.9 on 12/31. -On Metformin at home which is currently on hold. -Currently on 5 units Lantus daily.  Diabetes care coordinator consulted.  I will start on NovoLog 4 mg 3 times daily Premeal.  Continue sliding scale insulin with Accu-Cheks. Recent Labs  Lab 02/03/20 1128 02/03/20 1640 02/03/20 2100 02/04/20 0746 02/04/20 1201  GLUCAP 256* 267* 207* 165* 246*   Hypertension -Currently continued home medication Toprol-XL, ramipril,, Norvasc.  Ramipril on hold. -  Blood pressure elevated in 140s.  Partially secondary to IV steroids.  Continue to monitor.  Hyperlipidemia -Reports PCP prescribed statin, but patient prefers not to take it.  Hyperkalemia -Potassium improved this morning.  Ramipril on hold. Repeat labs tomorrow. Recent Labs  Lab 01/28/20 1951 01/31/20 1437 02/01/20 0556 02/02/20 0405 02/03/20 0333 02/04/20 0414  K 4.1 4.3 4.7 4.7 5.5* 4.7   Mobility: Encourage ambulation Code Status:   Code Status: Full Code  Nutritional status: Body mass index is 35.7 kg/m.     Diet Order            Diet heart healthy/carb modified Room service appropriate? Yes; Fluid consistency: Thin  Diet effective now                 DVT prophylaxis: Lovenox subcu Antimicrobials:  None Fluid: None Consultants: None Family Communication:   None  Status is: Inpatient  Remains inpatient appropriate because: Continued oxygen requirement  Dispo: The patient is from: Home              Anticipated d/c is to: Home              Anticipated d/c date is: 1 to 2 days              Patient currently is not medically stable to d/c.     Infusions:  . remdesivir 200 mg in sodium chloride 0.9% 250 mL IVPB     Followed by  . remdesivir 100 mg in NS 100 mL 100 mg (02/04/20 1011)    Scheduled Meds: . amLODipine  10 mg Oral Daily  . vitamin C  500 mg Oral Daily  . cholecalciferol  1,000 Units Oral Daily  . enoxaparin (LOVENOX) injection  60 mg Subcutaneous Q24H  . insulin aspart  0-15 Units Subcutaneous TID WC  . insulin aspart  0-5 Units Subcutaneous QHS  . insulin glargine  5 Units Subcutaneous Daily  . methylPREDNISolone (SOLU-MEDROL) injection  40 mg Intravenous Q12H  . metoprolol succinate  50 mg Oral Daily  . zinc sulfate  220 mg Oral Daily    Antimicrobials: Anti-infectives (From admission, onward)   Start     Dose/Rate Route Frequency Ordered Stop   02/02/20 1000  remdesivir 100 mg in sodium chloride 0.9 % 100 mL IVPB       "Followed by" Linked Group Details   100 mg 200 mL/hr over 30 Minutes Intravenous Daily 02/01/20 1756 02/06/20 0959   02/01/20 1830  remdesivir 200 mg in sodium chloride 0.9% 250 mL IVPB       "Followed by" Linked Group Details   200 mg 580 mL/hr over 30 Minutes Intravenous Once 02/01/20 1756     02/01/20 1000  remdesivir 100 mg in sodium chloride 0.9 % 100 mL IVPB  Status:  Discontinued       "Followed by" Linked Group Details   100 mg 200 mL/hr over 30 Minutes Intravenous Daily 01/31/20 1640 01/31/20 1649   01/31/20 1800  remdesivir 200 mg in sodium chloride 0.9% 250 mL IVPB  Status:  Discontinued       "Followed by" Linked Group Details   200 mg 580 mL/hr over 30 Minutes Intravenous Once 01/31/20 1640 01/31/20 1649      PRN meds: acetaminophen, albuterol, alum & mag hydroxide-simeth,  chlorpheniramine-HYDROcodone, guaiFENesin-dextromethorphan, ondansetron **OR** ondansetron (ZOFRAN) IV   Objective: Vitals:   02/04/20 0519 02/04/20 1001  BP: 138/82 (!) 145/87  Pulse: 75 85  Resp: 18  Temp: 98.7 F (37.1 C)   SpO2: 95%     Intake/Output Summary (Last 24 hours) at 02/04/2020 1443 Last data filed at 02/04/2020 0400 Gross per 24 hour  Intake 480 ml  Output 600 ml  Net -120 ml   Filed Weights   02/01/20 1826  Weight: 116.1 kg   Weight change:  Body mass index is 35.7 kg/m.   Physical Exam: General exam: Pleasant, middle-aged Caucasian male.  Not in physical distress Skin: No rashes, lesions or ulcers. HEENT: Atraumatic, normocephalic, no obvious bleeding Lungs: Diminished air entry in both bases CVS: Regular rate and rhythm, no murmur GI/Abd soft, nontender, nondistended, bowel sound present CNS: Alert, awake, oriented x3 Psychiatry: Seems anxious Extremities: No pedal edema, no calf tenderness  Data Review: I have personally reviewed the laboratory data and studies available.  Recent Labs  Lab 01/31/20 1437 02/01/20 0556 02/02/20 0405 02/03/20 0333 02/04/20 0414  WBC 6.8 6.0 8.8 9.6 10.0  NEUTROABS 6.0 5.0 7.9* 8.5* 8.3*  HGB 15.5 14.3 14.3 14.6 14.7  HCT 43.9 40.6 40.4 41.7 41.8  MCV 92.4 92.5 93.1 94.8 93.7  PLT 225 246 246 267 273   Recent Labs  Lab 01/31/20 1437 02/01/20 0556 02/02/20 0405 02/03/20 0333 02/04/20 0414  NA 134* 137 135 134* 135  K 4.3 4.7 4.7 5.5* 4.7  CL 99 102 102 101 100  CO2 20* 25 24 25 24   GLUCOSE 258* 237* 223* 250* 167*  BUN 18 19 20 22  24*  CREATININE 0.97 1.02 0.96 1.07 1.10  CALCIUM 8.7* 8.8* 8.7* 8.7* 8.8*  MG  --  2.4 2.2 2.1 2.2  PHOS  --  4.1 3.5 4.1 4.1    F/u labs ordered  Signed, , MD [facility] 02/04/2020

## 2020-02-04 NOTE — Progress Notes (Signed)
Inpatient Diabetes Program Recommendations  AACE/ADA: New Consensus Statement on Inpatient Glycemic Control (2015)  Target Ranges:  Prepandial:   less than 140 mg/dL      Peak postprandial:   less than 180 mg/dL (1-2 hours)      Critically ill patients:  140 - 180 mg/dL   Results for Andrew Cross, Andrew Cross (MRN [id number]) as of 02/04/2020 12:04  Ref. Range 02/03/2020 07:35 02/03/2020 11:28 02/03/2020 16:40 02/03/2020 21:00  Glucose-Capillary Latest Ref Range: 70 - 99 mg/dL 387 (H)  5 units NOVOLOG  256 (H)  8 units NOVOLOG  267 (H)  8 units NOVOLOG  5 units LANTUS @6pm   207 (H)  2 units NOVOLOG    Results for Andrew Cross, Andrew Cross (MRN Rosey Bath) as of 02/04/2020 12:04  Ref. Range 02/04/2020 07:46 02/04/2020 12:01  Glucose-Capillary Latest Ref Range: 70 - 99 mg/dL 04/03/2020 (H)  3 units NOVOLOG  5 units LANTUS 246 (H)   Home DM Meds: Metformin 500 mg BID  Current Orders: Lantus 5 units Daily      Novolog 0-15 units ac/hs     Solumedrol 40 mg BID.   Lantus started yest at 6pm--another dose on board this AM.    AM CBG down to 165 this AM but back up to 246 at 12pm.  MD- Please consider starting Novolog Meal Coverage: Novolog 4 units TID with meals to start  (Please add the following Hold Parameters: Hold if pt eats <50% of meal, Hold if pt NPO)    --Will follow patient during hospitalization--  884 RN, MSN, CDE Diabetes Coordinator Inpatient Glycemic Control Team Team Pager: [phone] (8a-5p)

## 2020-02-05 DIAGNOSIS — U071 COVID-19: Secondary | ICD-10-CM | POA: Diagnosis not present

## 2020-02-05 DIAGNOSIS — J9601 Acute respiratory failure with hypoxia: Secondary | ICD-10-CM | POA: Diagnosis not present

## 2020-02-05 LAB — CBC WITH DIFFERENTIAL/PLATELET
Abs Immature Granulocytes: 0.15 10*3/uL — ABNORMAL HIGH (ref 0.00–0.07)
Basophils Absolute: 0 10*3/uL (ref 0.0–0.1)
Basophils Relative: 0 %
Eosinophils Absolute: 0 10*3/uL (ref 0.0–0.5)
Eosinophils Relative: 0 %
HCT: 43.2 % (ref 39.0–52.0)
Hemoglobin: 15.4 g/dL (ref 13.0–17.0)
Immature Granulocytes: 2 %
Lymphocytes Relative: 5 %
Lymphs Abs: 0.5 10*3/uL — ABNORMAL LOW (ref 0.7–4.0)
MCH: 33.4 pg (ref 26.0–34.0)
MCHC: 35.6 g/dL (ref 30.0–36.0)
MCV: 93.7 fL (ref 80.0–100.0)
Monocytes Absolute: 0.4 10*3/uL (ref 0.1–1.0)
Monocytes Relative: 4 %
Neutro Abs: 8.8 10*3/uL — ABNORMAL HIGH (ref 1.7–7.7)
Neutrophils Relative %: 89 %
Platelets: 308 10*3/uL (ref 150–400)
RBC: 4.61 MIL/uL (ref 4.22–5.81)
RDW: 11.3 % — ABNORMAL LOW (ref 11.5–15.5)
WBC: 9.9 10*3/uL (ref 4.0–10.5)
nRBC: 0 % (ref 0.0–0.2)

## 2020-02-05 LAB — CULTURE, BLOOD (ROUTINE X 2)
Culture: NO GROWTH
Culture: NO GROWTH
Special Requests: ADEQUATE
Special Requests: ADEQUATE

## 2020-02-05 LAB — COMPREHENSIVE METABOLIC PANEL
ALT: 38 U/L (ref 0–44)
AST: 20 U/L (ref 15–41)
Albumin: 3.2 g/dL — ABNORMAL LOW (ref 3.5–5.0)
Alkaline Phosphatase: 51 U/L (ref 38–126)
Anion gap: 11 (ref 5–15)
BUN: 23 mg/dL (ref 8–23)
CO2: 24 mmol/L (ref 22–32)
Calcium: 9.1 mg/dL (ref 8.9–10.3)
Chloride: 101 mmol/L (ref 98–111)
Creatinine, Ser: 1 mg/dL (ref 0.61–1.24)
GFR, Estimated: >60 mL/min (ref >60)
Glucose, Bld: 200 mg/dL — ABNORMAL HIGH (ref 70–99)
Potassium: 5.2 mmol/L — ABNORMAL HIGH (ref 3.5–5.1)
Sodium: 136 mmol/L (ref 135–145)
Total Bilirubin: 0.8 mg/dL (ref 0.3–1.2)
Total Protein: 6.1 g/dL — ABNORMAL LOW (ref 6.5–8.1)

## 2020-02-05 LAB — D-DIMER, QUANTITATIVE: D-Dimer, Quant: 0.48 ug/mL-FEU (ref 0.00–0.50)

## 2020-02-05 LAB — GLUCOSE, CAPILLARY
Glucose-Capillary: 194 mg/dL — ABNORMAL HIGH (ref 70–99)
Glucose-Capillary: 211 mg/dL — ABNORMAL HIGH (ref 70–99)
Glucose-Capillary: 235 mg/dL — ABNORMAL HIGH (ref 70–99)
Glucose-Capillary: 188 mg/dL — ABNORMAL HIGH (ref 70–99)

## 2020-02-05 LAB — FERRITIN: Ferritin: 709 ng/mL — ABNORMAL HIGH (ref 24–336)

## 2020-02-05 LAB — C-REACTIVE PROTEIN: CRP: 6.5 mg/dL — ABNORMAL HIGH (ref <1.0)

## 2020-02-05 LAB — MAGNESIUM: Magnesium: 2.5 mg/dL — ABNORMAL HIGH (ref 1.7–2.4)

## 2020-02-05 LAB — PHOSPHORUS: Phosphorus: 4.6 mg/dL (ref 2.5–4.6)

## 2020-02-05 MED ORDER — TOCILIZUMAB 400 MG/20ML IV SOLN
800.0000 mg | Freq: Once | INTRAVENOUS | Status: AC
  Administered 2020-02-05: 800 mg via INTRAVENOUS
  Filled 2020-02-05: qty 40

## 2020-02-05 MED ORDER — SALINE SPRAY 0.65 % NA SOLN
2.0000 | NASAL | Status: DC | PRN
  Administered 2020-02-05: 2 via NASAL
  Filled 2020-02-05: qty 44

## 2020-02-05 MED ORDER — INSULIN GLARGINE 100 UNIT/ML ~~LOC~~ SOLN
7.0000 [IU] | Freq: Every day | SUBCUTANEOUS | Status: DC
  Administered 2020-02-06: 7 [IU] via SUBCUTANEOUS
  Filled 2020-02-05: qty 0.07

## 2020-02-05 MED ORDER — SODIUM CHLORIDE 0.9 % IV SOLN
100.0000 mg | Freq: Once | INTRAVENOUS | Status: AC
  Administered 2020-02-06: 100 mg via INTRAVENOUS
  Filled 2020-02-05: qty 20

## 2020-02-05 MED ORDER — INSULIN ASPART 100 UNIT/ML ~~LOC~~ SOLN
6.0000 [IU] | Freq: Three times a day (TID) | SUBCUTANEOUS | Status: DC
  Administered 2020-02-05 – 2020-02-07 (×6): 6 [IU] via SUBCUTANEOUS

## 2020-02-05 MED ORDER — TOCILIZUMAB 400 MG/20ML IV SOLN: 4.0000 mg/kg | Freq: Once | INTRAVENOUS | Status: DC

## 2020-02-05 NOTE — Plan of Care (Signed)
  Problem: Coping: Goal: Psychosocial and spiritual needs will be supported Outcome: Progressing   

## 2020-02-05 NOTE — Progress Notes (Signed)
PROGRESS NOTE  Andrew Cross  DOB: Apr 17, 1955  PCP: Donita Brooks, MD VZD:[id number]  DOA: 01/31/2020  LOS: 5 days   Chief Complaint  Patient presents with  . Covid Positive    Low sats    Brief narrative: JAYKO VOORHEES is a 65 y.o. male with PMH significant for T2DM, HTN, HLD Patient tested positive for COVID-19 as an outpatient.  On 12/30, he presented at infusion center.  Monoclonal antibody.  He was noted to be hypoxic and hence sent to the ED. Admitted for further evaluation management.  Subjective: Patient was seen and examined this morning. Propped up in bed.  Patient states that he feels fine but he is still requiring 5 L oxygen at rest.  While getting up from bed to chair, his oxygen saturation dropped down to 70s.  Inflammatory markers are trending up.  I had a long conversation with patient and his family.  Decision was made to give a dose of Actemra today.  Assessment/Plan: COVID pneumonia Acute respiratory failure with hypoxia  -Presented with shortness of breath, hypoxia -COVID test: Positive as an outpatient -CTA chest on admission showed patchy pneumonia  -Treatment: Patient completed a course of remdesivir today 1/4.  He is also on Solu-Medrol 40 mg twice daily.   -Patient states he feels better but is still hypoxic and worsens on minimal exertion.  He is informed the markers are worsening as well.   I had a long conversation with patient and his family.  Decision was made to give a dose of Actemra today. -Currently on 5 to 6 L oxygen at rest.  Wean down as tolerated. -Supportive care: Vitamin C, Zinc, PRN inhalers, Tylenol, Antitussives (benzonatate/ Mucinex/Tussionex).   -Encouraged incentive spirometry, prone position, out of bed and early mobilization as much as possible -Continue airborne/contact isolation precautions for duration of 3 weeks from the day of diagnosis. -WBC and inflammatory markers trend as below.  CRP significantly trending  up. Recent Labs  Lab 01/31/20 1437 01/31/20 1550 01/31/20 1745 02/01/20 0556 02/02/20 0405 02/03/20 0333 02/04/20 0414 02/05/20 0338  SARSCOV2NAA  --  POSITIVE*  --   --   --   --   --   --   WBC 6.8  --   --  6.0 8.8 9.6 10.0 9.9  LATICACIDVEN 1.8  --  1.8  --   --   --   --   --   PROCALCITON <0.10  --   --   --   --   --   --   --   DDIMER 0.54*  --   --   --  0.50 0.43 0.60* 0.48  FERRITIN 1,067*  --   --  849* 832* 705* 713* 709*  LDH 481*  --   --   --   --   --   --   --   CRP 5.3*  --   --  4.7* 2.3* 2.0* 3.3* 6.5*  ALT 51*  --   --  46* 43 41 38 38   Transaminitis  -likely due to Covid infection -Improved Recent Labs  Lab 02/01/20 0556 02/02/20 0405 02/03/20 0333 02/04/20 0414 02/05/20 0338  AST 31 32 25 23 20   ALT 46* 43 41 38 38  ALKPHOS 47 48 50 48 51  BILITOT 1.2 1.0 0.8 0.9 0.8  PROT 6.4* 5.9* 5.8* 5.9* 6.1*  ALBUMIN 3.3* 3.1* 3.1* 3.1* 3.2*   Type 2 diabetes mellitus -A1c 7.9 on 12/31. -On  Metformin at home which is currently on hold. -Currently on 5 units Lantus daily and scheduled Premeal NovoLog 4 units 3 times daily. -Blood sugar still remains elevated. Diabetes care coordinator consulted. -I increase Lantus to 7 units and premeal NovoLog to 6 units 3 times daily today -Continue sliding scale insulin with Accu-Cheks. Recent Labs  Lab 02/04/20 1201 02/04/20 1737 02/04/20 1953 02/05/20 0813 02/05/20 1120  GLUCAP 246* 322* 350* 194* 211*   Hypertension -Currently continued home medication Toprol-XL, ramipril,, Norvasc.  Ramipril on hold. -Blood pressure elevated in 140s.  Partially secondary to IV steroids.  Continue to monitor.  Hyperlipidemia -Reports PCP prescribed statin, but patient prefers not to take it. Mobility: Encourage ambulation Code Status:   Code Status: Full Code  Nutritional status: Body mass index is 35.7 kg/m.     Diet Order            Diet heart healthy/carb modified Room service appropriate? Yes; Fluid  consistency: Thin  Diet effective now                 DVT prophylaxis: Lovenox subcu Antimicrobials:  None Fluid: None Consultants: None Family Communication:  None  Status is: Inpatient  Remains inpatient appropriate because: Continued oxygen requirement  Dispo: The patient is from: Home              Anticipated d/c is to: Home              Anticipated d/c date is: 2 to 3 days              Patient currently is not medically stable to d/c.     Infusions:  . [START ON 02/06/2020] remdesivir 100 mg in NS 100 mL    . remdesivir 200 mg in sodium chloride 0.9% 250 mL IVPB      Scheduled Meds: . amLODipine  10 mg Oral Daily  . vitamin C  500 mg Oral Daily  . cholecalciferol  1,000 Units Oral Daily  . enoxaparin (LOVENOX) injection  60 mg Subcutaneous Q24H  . insulin aspart  0-15 Units Subcutaneous TID WC  . insulin aspart  0-5 Units Subcutaneous QHS  . insulin aspart  6 Units Subcutaneous TID WC  . [START ON 02/06/2020] insulin glargine  7 Units Subcutaneous Daily  . methylPREDNISolone (SOLU-MEDROL) injection  40 mg Intravenous Q12H  . metoprolol succinate  50 mg Oral Daily  . zinc sulfate  220 mg Oral Daily    Antimicrobials: Anti-infectives (From admission, onward)   Start     Dose/Rate Route Frequency Ordered Stop   02/06/20 1000  remdesivir 100 mg in sodium chloride 0.9 % 100 mL IVPB        100 mg 200 mL/hr over 30 Minutes Intravenous  Once 02/05/20 1017     02/02/20 1000  remdesivir 100 mg in sodium chloride 0.9 % 100 mL IVPB       "Followed by" Linked Group Details   100 mg 200 mL/hr over 30 Minutes Intravenous Daily 02/01/20 1756 02/05/20 1036   02/01/20 1830  remdesivir 200 mg in sodium chloride 0.9% 250 mL IVPB       "Followed by" Linked Group Details   200 mg 580 mL/hr over 30 Minutes Intravenous Once 02/01/20 1756     02/01/20 1000  remdesivir 100 mg in sodium chloride 0.9 % 100 mL IVPB  Status:  Discontinued       "Followed by" Linked Group Details   100  mg 200 mL/hr over  30 Minutes Intravenous Daily 01/31/20 1640 01/31/20 1649   01/31/20 1800  remdesivir 200 mg in sodium chloride 0.9% 250 mL IVPB  Status:  Discontinued       "Followed by" Linked Group Details   200 mg 580 mL/hr over 30 Minutes Intravenous Once 01/31/20 1640 01/31/20 1649      PRN meds: acetaminophen, albuterol, alum & mag hydroxide-simeth, chlorpheniramine-HYDROcodone, guaiFENesin-dextromethorphan, ondansetron **OR** ondansetron (ZOFRAN) IV, sodium chloride   Objective: Vitals:   02/05/20 0652 02/05/20 1402  BP:  134/85  Pulse:  87  Resp:  20  Temp:  97.9 F (36.6 C)  SpO2: 90% 96%   No intake or output data in the 24 hours ending 02/05/20 1558 Filed Weights   02/01/20 1826  Weight: 116.1 kg   Weight change:  Body mass index is 35.7 kg/m.   Physical Exam: General exam: Pleasant, middle-aged Caucasian male.  Not in physical distress Skin: No rashes, lesions or ulcers. HEENT: Atraumatic, normocephalic, no obvious bleeding Lungs: Diminished air entry in both bases.  Clear to auscultation otherwise CVS: Regular rate and rhythm, no murmur GI/Abd soft, nontender, nondistended, bowel sound present CNS: Alert, awake, oriented x3 Psychiatry: Seems anxious Extremities: No pedal edema, no calf tenderness  Data Review: I have personally reviewed the laboratory data and studies available.  Recent Labs  Lab 02/01/20 0556 02/02/20 0405 02/03/20 0333 02/04/20 0414 02/05/20 0338  WBC 6.0 8.8 9.6 10.0 9.9  NEUTROABS 5.0 7.9* 8.5* 8.3* 8.8*  HGB 14.3 14.3 14.6 14.7 15.4  HCT 40.6 40.4 41.7 41.8 43.2  MCV 92.5 93.1 94.8 93.7 93.7  PLT 246 246 267 273 308   Recent Labs  Lab 02/01/20 0556 02/02/20 0405 02/03/20 0333 02/04/20 0414 02/05/20 0338  NA 137 135 134* 135 136  K 4.7 4.7 5.5* 4.7 5.2*  CL 102 102 101 100 101  CO2 25 24 25 24 24   GLUCOSE 237* 223* 250* 167* 200*  BUN 19 20 22  24* 23  CREATININE 1.02 0.96 1.07 1.10 1.00  CALCIUM 8.8* 8.7* 8.7*  8.8* 9.1  MG 2.4 2.2 2.1 2.2 2.5*  PHOS 4.1 3.5 4.1 4.1 4.6    F/u labs ordered  Signed, Terrilee Croak, MD [facility] 02/05/2020

## 2020-02-05 NOTE — Progress Notes (Signed)
Inpatient Diabetes Program Recommendations  AACE/ADA: New Consensus Statement on Inpatient Glycemic Control (2015)  Target Ranges:  Prepandial:   less than 140 mg/dL      Peak postprandial:   less than 180 mg/dL (1-2 hours)      Critically ill patients:  140 - 180 mg/dL   Results for Andrew Cross, Andrew Cross (MRN [id number]) as of 02/05/2020 11:40  Ref. Range 02/04/2020 07:46 02/04/2020 12:01 02/04/2020 17:37 02/04/2020 19:53  Glucose-Capillary Latest Ref Range: 70 - 99 mg/dL 888 (H)  3 units NOVOLOG  246 (H)  5 units NOVOLOG  5 units LANTUS @10am   322 (H)  15 units NOVOLOG  350 (H)  4 units NOVOLOG    Results for Andrew Cross, Andrew Cross (MRN Rosey Bath) as of 02/05/2020 11:40  Ref. Range 02/05/2020 08:13 02/05/2020 11:20  Glucose-Capillary Latest Ref Range: 70 - 99 mg/dL 04/04/2020 (H)  7 units NOVOLOG @10am   5 units LANTUS @10am  211 (H)   Home DM Meds: Metformin 500 mg BID  Current Orders: Lantus 5 units Daily                            Novolog 0-15 units ac/hs       Novolog 4 units TID with meals     Solumedrol 40 mg BID.     MD- Note Novolog 4 units TID with meals (meal coverage) started yest at 5pm with Dinner.  CBGs still slightly elevated today.  Please consider:  1. Increase Lantus slightly to 7 units Daily  2. Increase Novolog Meal Coverage to 6 units TID with meals    --Will follow patient during hospitalization--  882 RN, MSN, CDE Diabetes Coordinator Inpatient Glycemic Control Team Team Pager: [phone] (8a-5p)

## 2020-02-06 DIAGNOSIS — U071 COVID-19: Secondary | ICD-10-CM | POA: Diagnosis not present

## 2020-02-06 DIAGNOSIS — J9601 Acute respiratory failure with hypoxia: Secondary | ICD-10-CM | POA: Diagnosis not present

## 2020-02-06 LAB — COMPREHENSIVE METABOLIC PANEL
ALT: 40 U/L (ref 0–44)
AST: 20 U/L (ref 15–41)
Albumin: 3.6 g/dL (ref 3.5–5.0)
Alkaline Phosphatase: 55 U/L (ref 38–126)
Anion gap: 12 (ref 5–15)
BUN: 25 mg/dL — ABNORMAL HIGH (ref 8–23)
CO2: 22 mmol/L (ref 22–32)
Calcium: 8.9 mg/dL (ref 8.9–10.3)
Chloride: 99 mmol/L (ref 98–111)
Creatinine, Ser: 1.11 mg/dL (ref 0.61–1.24)
GFR, Estimated: >60 mL/min (ref >60)
Glucose, Bld: 210 mg/dL — ABNORMAL HIGH (ref 70–99)
Potassium: 4.5 mmol/L (ref 3.5–5.1)
Sodium: 133 mmol/L — ABNORMAL LOW (ref 135–145)
Total Bilirubin: 0.9 mg/dL (ref 0.3–1.2)
Total Protein: 7.1 g/dL (ref 6.5–8.1)

## 2020-02-06 LAB — GLUCOSE, CAPILLARY
Glucose-Capillary: 237 mg/dL — ABNORMAL HIGH (ref 70–99)
Glucose-Capillary: 231 mg/dL — ABNORMAL HIGH (ref 70–99)
Glucose-Capillary: 249 mg/dL — ABNORMAL HIGH (ref 70–99)
Glucose-Capillary: 214 mg/dL — ABNORMAL HIGH (ref 70–99)

## 2020-02-06 LAB — CBC WITH DIFFERENTIAL/PLATELET
Abs Immature Granulocytes: 0.2 10*3/uL — ABNORMAL HIGH (ref 0.00–0.07)
Basophils Absolute: 0 10*3/uL (ref 0.0–0.1)
Basophils Relative: 0 %
Eosinophils Absolute: 0 10*3/uL (ref 0.0–0.5)
Eosinophils Relative: 0 %
HCT: 47.4 % (ref 39.0–52.0)
Hemoglobin: 16.9 g/dL (ref 13.0–17.0)
Immature Granulocytes: 1 %
Lymphocytes Relative: 4 %
Lymphs Abs: 0.6 10*3/uL — ABNORMAL LOW (ref 0.7–4.0)
MCH: 32.8 pg (ref 26.0–34.0)
MCHC: 35.7 g/dL (ref 30.0–36.0)
MCV: 91.9 fL (ref 80.0–100.0)
Monocytes Absolute: 0.7 10*3/uL (ref 0.1–1.0)
Monocytes Relative: 5 %
Neutro Abs: 14.5 10*3/uL — ABNORMAL HIGH (ref 1.7–7.7)
Neutrophils Relative %: 90 %
Platelets: 467 10*3/uL — ABNORMAL HIGH (ref 150–400)
RBC: 5.16 MIL/uL (ref 4.22–5.81)
RDW: 11.6 % (ref 11.5–15.5)
WBC: 16.1 10*3/uL — ABNORMAL HIGH (ref 4.0–10.5)
nRBC: 0 % (ref 0.0–0.2)

## 2020-02-06 LAB — D-DIMER, QUANTITATIVE: D-Dimer, Quant: 0.61 ug/mL-FEU — ABNORMAL HIGH (ref 0.00–0.50)

## 2020-02-06 LAB — C-REACTIVE PROTEIN: CRP: 2.3 mg/dL — ABNORMAL HIGH (ref <1.0)

## 2020-02-06 MED ORDER — FUROSEMIDE 10 MG/ML IJ SOLN
20.0000 mg | Freq: Once | INTRAMUSCULAR | Status: AC
  Administered 2020-02-06: 20 mg via INTRAVENOUS
  Filled 2020-02-06: qty 2

## 2020-02-06 MED ORDER — INSULIN GLARGINE 100 UNIT/ML ~~LOC~~ SOLN
10.0000 [IU] | Freq: Every day | SUBCUTANEOUS | Status: DC
  Administered 2020-02-07: 10 [IU] via SUBCUTANEOUS
  Filled 2020-02-06: qty 0.1

## 2020-02-06 MED ORDER — FAMOTIDINE 20 MG PO TABS
20.0000 mg | ORAL_TABLET | Freq: Two times a day (BID) | ORAL | Status: DC
  Administered 2020-02-06 – 2020-02-07 (×2): 20 mg via ORAL
  Filled 2020-02-06 (×2): qty 1

## 2020-02-06 NOTE — Progress Notes (Signed)
PROGRESS NOTE  Andrew Cross  DOB: 01-Apr-1955  PCP: Donita Brooks, MD ZOX:[id number]  DOA: 01/31/2020  LOS: 6 days   Chief Complaint  Patient presents with  . Covid Positive    Low sats    Brief narrative: Andrew Cross is a 65 y.o. male with PMH significant for T2DM, HTN, HLD Patient tested positive for COVID-19 as an outpatient.  On 12/30, he presented at infusion center.  Monoclonal antibody.  He was noted to be hypoxic and hence sent to the ED. Admitted for further evaluation management.  Subjective: Patient with no O2 supplementation at rest with O2 sats in the low 90s.  Has not ambulated yet.  Reports feeling better overall.   Assessment/Plan: COVID pneumonia Acute respiratory failure with hypoxia  -Presented with shortness of breath, hypoxia -COVID test: Positive as an outpatient -CTA chest on admission showed patchy pneumonia  -Treatment: Patient completed a course of remdesivir today 1/5.  He is also on Solu-Medrol 40 mg twice daily.    -on 1/4:Patient states he feels better but is still hypoxic and worsens on minimal exertion.  He is informed the markers are worsening as well.   I had a long conversation with patient and his family.  Decision was made to give a dose of Actemra today. Currently on 5 to 6 L oxygen at rest.  Wean down as tolerated.  On  02/06/20 CRP trended down overnight, WBC trended up. He feels better with apparent improvement of his hypoxia.  Will need walk test. Unclear if he will be ready for discharge. Given the elevated WBC with recent Actemra treatment, will need continued monitoring for possible superimposed bacterial pneumonia.   The plan is to continue:  -Supportive care: Vitamin C, Zinc, PRN inhalers, Tylenol, Antitussives (benzonatate/ Mucinex/Tussionex).   -Encouraged incentive spirometry, prone position, out of bed and early mobilization as much as possible -Continue airborne/contact isolation precautions for duration of 3  weeks from the day of diagnosis. -repeat labs in am.   Recent Labs  Lab 01/31/20 1437 01/31/20 1550 01/31/20 1745 02/01/20 0556 02/02/20 0405 02/03/20 0333 02/04/20 0414 02/05/20 0338 02/06/20 1222  SARSCOV2NAA  --  POSITIVE*  --   --   --   --   --   --   --   WBC 6.8  --   --  6.0 8.8 9.6 10.0 9.9 16.1*  LATICACIDVEN 1.8  --  1.8  --   --   --   --   --   --   PROCALCITON <0.10  --   --   --   --   --   --   --   --   DDIMER 0.54*  --   --   --  0.50 0.43 0.60* 0.48 0.61*  FERRITIN 1,067*  --   --  849* 832* 705* 713* 709*  --   LDH 481*  --   --   --   --   --   --   --   --   CRP 5.3*  --   --  4.7* 2.3* 2.0* 3.3* 6.5* 2.3*  ALT 51*  --   --  46* 43 41 38 38 40   Transaminitis  -likely due to Covid infection -Improved Recent Labs  Lab 02/02/20 0405 02/03/20 0333 02/04/20 0414 02/05/20 0338 02/06/20 1222  AST 32 25 23 20 20   ALT 43 41 38 38 40  ALKPHOS 48 50 48 51 55  BILITOT 1.0  0.8 0.9 0.8 0.9  PROT 5.9* 5.8* 5.9* 6.1* 7.1  ALBUMIN 3.1* 3.1* 3.1* 3.2* 3.6   Type 2 diabetes mellitus -A1c 7.9 on 12/31. -On Metformin at home which is currently on hold. -Currently on 5 units Lantus daily and scheduled Premeal NovoLog 4 units 3 times daily. -Blood sugar still remains elevated. Diabetes care coordinator consulted. -I increase Lantus to 7 units and premeal NovoLog to 6 units 3 times daily today -Continue sliding scale insulin with Accu-Cheks.  02/06/20 Blood sugar remains elevated, increased Lantus to 10 units hs Recent Labs  Lab 02/05/20 1716 02/05/20 2111 02/06/20 0748 02/06/20 1143 02/06/20 1659  GLUCAP 235* 188* 237* 231* 249*   Hypertension -Currently continued home medication Toprol-XL, ramipril,, Norvasc.  Ramipril on hold. -Blood pressure elevated in 140s.  Partially secondary to IV steroids.  Continue to monitor.  Hyperlipidemia -Reports PCP prescribed statin, but patient prefers not to take it.   GERD Discontinued Maalox due to recent  elevated magnesium level.  Start famotidine.   Mobility: Encourage ambulation Code Status:   Code Status: Full Code  Nutritional status: Body mass index is 35.7 kg/m.     Diet Order            Diet heart healthy/carb modified Room service appropriate? Yes; Fluid consistency: Thin  Diet effective now                 DVT prophylaxis: Lovenox subcu Antimicrobials:  None Fluid: None Consultants: None Family Communication:  None  Status is: Inpatient  Remains inpatient appropriate because: Continued oxygen requirement  Dispo: The patient is from: Home              Anticipated d/c is to: Home              Anticipated d/c date is: 2 to 3 days              Patient currently is not medically stable to d/c.     Infusions:  . remdesivir 200 mg in sodium chloride 0.9% 250 mL IVPB      Scheduled Meds: . amLODipine  10 mg Oral Daily  . vitamin C  500 mg Oral Daily  . cholecalciferol  1,000 Units Oral Daily  . enoxaparin (LOVENOX) injection  60 mg Subcutaneous Q24H  . famotidine  20 mg Oral BID  . insulin aspart  0-15 Units Subcutaneous TID WC  . insulin aspart  0-5 Units Subcutaneous QHS  . insulin aspart  6 Units Subcutaneous TID WC  . [START ON 02/07/2020] insulin glargine  10 Units Subcutaneous Daily  . methylPREDNISolone (SOLU-MEDROL) injection  40 mg Intravenous Q12H  . metoprolol succinate  50 mg Oral Daily  . zinc sulfate  220 mg Oral Daily    Antimicrobials: Anti-infectives (From admission, onward)   Start     Dose/Rate Route Frequency Ordered Stop   02/06/20 1000  remdesivir 100 mg in sodium chloride 0.9 % 100 mL IVPB        100 mg 200 mL/hr over 30 Minutes Intravenous  Once 02/05/20 1017 02/06/20 1042   02/02/20 1000  remdesivir 100 mg in sodium chloride 0.9 % 100 mL IVPB       "Followed by" Linked Group Details   100 mg 200 mL/hr over 30 Minutes Intravenous Daily 02/01/20 1756 02/05/20 1036   02/01/20 1830  remdesivir 200 mg in sodium chloride 0.9% 250 mL  IVPB       "Followed by" Linked Group Details  200 mg 580 mL/hr over 30 Minutes Intravenous Once 02/01/20 1756     02/01/20 1000  remdesivir 100 mg in sodium chloride 0.9 % 100 mL IVPB  Status:  Discontinued       "Followed by" Linked Group Details   100 mg 200 mL/hr over 30 Minutes Intravenous Daily 01/31/20 1640 01/31/20 1649   01/31/20 1800  remdesivir 200 mg in sodium chloride 0.9% 250 mL IVPB  Status:  Discontinued       "Followed by" Linked Group Details   200 mg 580 mL/hr over 30 Minutes Intravenous Once 01/31/20 1640 01/31/20 1649      PRN meds: acetaminophen, albuterol, alum & mag hydroxide-simeth, chlorpheniramine-HYDROcodone, guaiFENesin-dextromethorphan, ondansetron **OR** ondansetron (ZOFRAN) IV, sodium chloride   Objective: Vitals:   02/06/20 1002 02/06/20 1337  BP: (!) 141/82 125/90  Pulse: 85 83  Resp:  (!) 24  Temp:  98.5 F (36.9 C)  SpO2:  93%    Intake/Output Summary (Last 24 hours) at 02/06/2020 1816 Last data filed at 02/06/2020 1522 Gross per 24 hour  Intake 927 ml  Output -  Net 927 ml   Filed Weights   02/01/20 1826  Weight: 116.1 kg   Weight change:  Body mass index is 35.7 kg/m.   Physical Exam: General exam: Sitting up in bed, in NAD.  Skin: No rashes, lesions or ulcers. HEENT: Atraumatic, normocephalic Lungs: No cough, normal rate CVS:RRR, S1 and S2 present.  GI/Abd soft, nontender, nondistended CNS: Alert, awake, oriented x3 Psychiatry: Appropriate mood and affect Extremities: No pedal edema, no cyanosis.   Data Review: I have personally reviewed the laboratory data and studies available.  Recent Labs  Lab 02/02/20 0405 02/03/20 0333 02/04/20 0414 02/05/20 0338 02/06/20 1222  WBC 8.8 9.6 10.0 9.9 16.1*  NEUTROABS 7.9* 8.5* 8.3* 8.8* 14.5*  HGB 14.3 14.6 14.7 15.4 16.9  HCT 40.4 41.7 41.8 43.2 47.4  MCV 93.1 94.8 93.7 93.7 91.9  PLT 246 267 273 308 467*   Recent Labs  Lab 02/01/20 0556 02/02/20 0405 02/03/20 0333  02/04/20 0414 02/05/20 0338 02/06/20 1222  NA 137 135 134* 135 136 133*  K 4.7 4.7 5.5* 4.7 5.2* 4.5  CL 102 102 101 100 101 99  CO2 25 24 25 24 24 22   GLUCOSE 237* 223* 250* 167* 200* 210*  BUN 19 20 22  24* 23 25*  CREATININE 1.02 0.96 1.07 1.10 1.00 1.11  CALCIUM 8.8* 8.7* 8.7* 8.8* 9.1 8.9  MG 2.4 2.2 2.1 2.2 2.5*  --   PHOS 4.1 3.5 4.1 4.1 4.6  --     F/u labs ordered  Signed, Blain Pais, MD [facility] 02/06/2020

## 2020-02-06 NOTE — Plan of Care (Signed)
  Problem: Coping: Goal: Psychosocial and spiritual needs will be supported Outcome: Progressing   

## 2020-02-07 DIAGNOSIS — J9601 Acute respiratory failure with hypoxia: Secondary | ICD-10-CM | POA: Diagnosis not present

## 2020-02-07 DIAGNOSIS — U071 COVID-19: Secondary | ICD-10-CM | POA: Diagnosis not present

## 2020-02-07 LAB — COMPREHENSIVE METABOLIC PANEL
ALT: 34 U/L (ref 0–44)
AST: 20 U/L (ref 15–41)
Albumin: 3.1 g/dL — ABNORMAL LOW (ref 3.5–5.0)
Alkaline Phosphatase: 50 U/L (ref 38–126)
Anion gap: 9 (ref 5–15)
BUN: 32 mg/dL — ABNORMAL HIGH (ref 8–23)
CO2: 25 mmol/L (ref 22–32)
Calcium: 8.8 mg/dL — ABNORMAL LOW (ref 8.9–10.3)
Chloride: 100 mmol/L (ref 98–111)
Creatinine, Ser: 1.2 mg/dL (ref 0.61–1.24)
GFR, Estimated: >60 mL/min (ref >60)
Glucose, Bld: 176 mg/dL — ABNORMAL HIGH (ref 70–99)
Potassium: 4.9 mmol/L (ref 3.5–5.1)
Sodium: 134 mmol/L — ABNORMAL LOW (ref 135–145)
Total Bilirubin: 0.8 mg/dL (ref 0.3–1.2)
Total Protein: 5.9 g/dL — ABNORMAL LOW (ref 6.5–8.1)

## 2020-02-07 LAB — CBC
HCT: 44.8 % (ref 39.0–52.0)
Hemoglobin: 15.6 g/dL (ref 13.0–17.0)
MCH: 32.8 pg (ref 26.0–34.0)
MCHC: 34.8 g/dL (ref 30.0–36.0)
MCV: 94.1 fL (ref 80.0–100.0)
Platelets: 298 10*3/uL (ref 150–400)
RBC: 4.76 MIL/uL (ref 4.22–5.81)
RDW: 11.8 % (ref 11.5–15.5)
WBC: 10.8 10*3/uL — ABNORMAL HIGH (ref 4.0–10.5)
nRBC: 0 % (ref 0.0–0.2)

## 2020-02-07 LAB — GLUCOSE, CAPILLARY
Glucose-Capillary: 181 mg/dL — ABNORMAL HIGH (ref 70–99)
Glucose-Capillary: 239 mg/dL — ABNORMAL HIGH (ref 70–99)

## 2020-02-07 LAB — D-DIMER, QUANTITATIVE: D-Dimer, Quant: 0.56 ug/mL-FEU — ABNORMAL HIGH (ref 0.00–0.50)

## 2020-02-07 LAB — C-REACTIVE PROTEIN: CRP: 1.3 mg/dL — ABNORMAL HIGH (ref <1.0)

## 2020-02-07 LAB — FERRITIN: Ferritin: 756 ng/mL — ABNORMAL HIGH (ref 24–336)

## 2020-02-07 MED ORDER — INSULIN STARTER KIT- PEN NEEDLES (ENGLISH)
1.0000 | Freq: Once | Status: AC
  Administered 2020-02-07: 1
  Filled 2020-02-07: qty 1

## 2020-02-07 MED ORDER — LIVING WELL WITH DIABETES BOOK
Freq: Once | Status: AC
  Administered 2020-02-07: 1
  Filled 2020-02-07: qty 1

## 2020-02-07 MED ORDER — FAMOTIDINE 20 MG PO TABS: 20.0000 mg | ORAL_TABLET | Freq: Two times a day (BID) | ORAL | 0 refills | Status: DC

## 2020-02-07 MED ORDER — ACETAMINOPHEN 325 MG PO TABS: 650.0000 mg | ORAL_TABLET | Freq: Four times a day (QID) | ORAL | Status: DC | PRN

## 2020-02-07 MED ORDER — INSULIN ASPART 100 UNIT/ML FLEXPEN: PEN_INJECTOR | SUBCUTANEOUS | 11 refills | Status: DC

## 2020-02-07 MED ORDER — BLOOD GLUCOSE METER KIT: PACK | 0 refills | Status: AC

## 2020-02-07 MED ORDER — SALINE SPRAY 0.65 % NA SOLN: 2.0000 | NASAL | 0 refills | Status: DC | PRN

## 2020-02-07 MED ORDER — ALCOHOL WIPES 70 % PADS: 1.0000 | MEDICATED_PAD | Freq: Four times a day (QID) | 0 refills | Status: DC

## 2020-02-07 MED ORDER — PREDNISONE 10 MG (21) PO TBPK: ORAL_TABLET | ORAL | 0 refills | Status: AC

## 2020-02-07 MED ORDER — GUAIFENESIN-DM 100-10 MG/5ML PO SYRP: 10.0000 mL | ORAL_SOLUTION | ORAL | 0 refills | Status: DC | PRN

## 2020-02-07 MED ORDER — INSULIN STARTER KIT- PEN NEEDLES (ENGLISH): 1.0000 | Freq: Once | 0 refills | Status: AC

## 2020-02-07 NOTE — Progress Notes (Signed)
Patient has been given discharge instructions, along with starter needles and book.  Patient administered insulin to himself without difficulty.  Teach back method  Was effective.  Sitting in room, no SOB noted.  Will continue to monitor until his wife comes with Oxygen that was delivered at home.

## 2020-02-07 NOTE — Progress Notes (Signed)
Patient will be discharged home via private Vehicle.  This Clinical research associate will review Insulin administration and give patient a "Living With Diabetes" Booklet."  Diabetic Coordinator has spoken with patient via phone.  Prescriptions will be given to patient.  Peripheral IV and pulse Ox will be discontinued.  Will wait for Oxygen to be delivered for home transport and address all needs/questions.

## 2020-02-07 NOTE — Progress Notes (Addendum)
Inpatient Diabetes Program Recommendations  AACE/ADA: New Consensus Statement on Inpatient Glycemic Control (2015)  Target Ranges:  Prepandial:   less than 140 mg/dL      Peak postprandial:   less than 180 mg/dL (1-2 hours)      Critically ill patients:  140 - 180 mg/dL   Lab Results  Component Value Date   GLUCAP 181 (H) 02/07/2020   HGBA1C 7.9 (H) 02/01/2020    Review of Glycemic Control  Inpatient Diabetes Program Recommendations:   Patient to go home on insulin. Spoke with patient via phone (DM coordinator @ [facility]). RN Betishia McIntyre-Williams plans to teach patient insulin administration prior to discharge home. Patient's daughter is a Engineer, civil (consulting) and patient states she will assist him as needed with insulin administration as well. Reviewed basic nutrition principals with patient including limiting sugary drinks and carbohydrates. Sending insulin pen instruction video to patient's wife.  Discharge needs: -CBG meter and supplies [phone] -Pen needles [phone] -Lantus solopen [phone] Spoke with Dr. Garald Braver and not discharging on Lantus. -Novolog flexpen [phone]  Thank you, Billy Fischer. Alexcis Bicking, RN, MSN, CDE  Diabetes Coordinator Inpatient Glycemic Control Team Team Pager [phone] (8am-5pm) 02/07/2020 11:30 AM

## 2020-02-07 NOTE — Discharge Summary (Signed)
Physician Discharge Summary  Andrew Cross NAT:[id number] DOB: 1955/05/18 DOA: 01/31/2020  PCP: Susy Frizzle, MD  Admit date: 01/31/2020 Discharge date: 02/07/2020  Admitted From: Home Disposition:  Home   Recommendations for Outpatient Follow-up:  1. Follow up with PCP in 1-2 weeks   Home Health:No Equipment/Devices:Yes, Oxygen  Discharge Condition:Stable CODE STATUS:Full Diet recommendation: Carb mod  Brief/Interim Summary: He has much improved in the past 48 hours after receiving 1 dose of Actemra. His O2 sats improved at rest but he continues to require 3lpm via [place] of O2 supplementation with ambulation.  His blood sugars are still elevated but he is on steroid treatment. He will have a prednisone taper at the time of discharge.  He was given prescription for a glucometer with strips and supplies and novolog pen for use at home. The diabetes educator was consulted prior to his discharge and he received teaching on insulin use and administration. He will continue metformin, a medication he had tolerated well pta. He will follow up with his primary care provider for continued monitoring of his COVID recovery and for monitoring of his diabetes.    Please see below for further details for this hospital course:  COVID pneumonia Acute respiratory failure with hypoxia  -Presented with shortness of breath, hypoxia -COVID test: Positive as an outpatient -CTA chest on admission showed patchy pneumonia  -Treatment: Patient completed a course of remdesivir today 1/5.  He is also on Solu-Medrol 40 mg twice daily.    -on 1/4:Patient states he feels better but is still hypoxic and worsens on minimal exertion.  He is informed the markers are worsening as well.   I had a long conversation with patient and his family.  Decision was made to give a dose of Actemra today. Currently on 5 to 6 L oxygen at rest.  Wean down as tolerated.  On  02/06/20 CRP trended down overnight, WBC trended  up. He feels better with apparent improvement of his hypoxia.  Will need walk test. Unclear if he will be ready for discharge. Given the elevated WBC with recent Actemra treatment, will need continued monitoring for possible superimposed bacterial pneumonia.   The plan is to continue:  -Supportive care: Vitamin C, Zinc, PRN inhalers, Tylenol, Antitussives (benzonatate/ Mucinex/Tussionex).   -Encouraged incentive spirometry, prone position, out of bed and early mobilization as much as possible -Continue airborne/contact isolation precautions for duration of 3 weeks from the day of diagnosis. -repeat labs in am.   Last Labs              Recent Labs  Lab 01/31/20 1437 01/31/20 1550 01/31/20 1745 02/01/20 0556 02/02/20 0405 02/03/20 0333 02/04/20 0414 02/05/20 0338 02/06/20 1222  [place]  --  POSITIVE*  --   --   --   --   --   --   --   WBC 6.8  --   --  6.0 8.8 9.6 10.0 9.9 16.1*  LATICACIDVEN 1.8  --  1.8  --   --   --   --   --   --   PROCALCITON <0.10  --   --   --   --   --   --   --   --   DDIMER 0.54*  --   --   --  0.50 0.43 0.60* 0.48 0.61*  FERRITIN 1,067*  --   --  849* 832* 705* 713* 709*  --   LDH 481*  --   --   --   --   --   --   --   --  CRP 5.3*  --   --  4.7* 2.3* 2.0* 3.3* 6.5* 2.3*  ALT 51*  --   --  46* 43 41 38 38 40     Transaminitis -likely due to Covid infection -Improved Last Labs   Recent Labs  Lab 02/02/20 0405 02/03/20 0333 02/04/20 0414 02/05/20 0338 02/06/20 1222  AST 32 25 23 20 20   ALT 43 41 38 38 40  ALKPHOS 48 50 48 51 55  BILITOT 1.0 0.8 0.9 0.8 0.9  PROT 5.9* 5.8* 5.9* 6.1* 7.1  ALBUMIN 3.1* 3.1* 3.1* 3.2* 3.6     Type 2 diabetes mellitus -A1c 7.9 on 12/31. -On Metformin at home which is currently on hold. -Currently on 5 units Lantus daily and scheduled Premeal NovoLog 4 units 3 times daily. -Blood sugar still remains elevated. Diabetes care coordinator consulted. -I increase Lantus to 7 units and premeal NovoLog  to 6 units 3 times daily today -Continue sliding scale insulin with Accu-Cheks.  02/06/20 Blood sugar remains elevated, increased Lantus to 10 units hs Last Labs          Recent Labs  Lab 02/05/20 1716 02/05/20 2111 02/06/20 0748 02/06/20 1143 02/06/20 1659  GLUCAP 235* 188* 237* 231* 249*     Hypertension -Currently continued home medication Toprol-XL, ramipril,, Norvasc.  Ramipril on hold. -Blood pressure elevated in 140s.  Partially secondary to IV steroids.  Continue to monitor.  Hyperlipidemia -Reports PCP prescribed statin,but patient prefers not to take it.   GERD Discontinued Maalox due to recent elevated magnesium level.  Start famotidine.     Discharge Diagnoses:  Principal Problem:   Acute hypoxemic respiratory failure due to COVID-19 [facility]) Active Problems:   Hypertension associated with diabetes ([place])   Diabetes mellitus type 2 with complications ([place])   Hyperlipidemia associated with type 2 diabetes mellitus [facility])    Discharge Instructions  Discharge Instructions    Diet - low sodium heart healthy   Complete by: As directed    Increase activity slowly   Complete by: As directed      Allergies as of 02/07/2020   No Known Allergies     Medication List    STOP taking these medications   hydrochlorothiazide 25 MG tablet Commonly known as: HYDRODIURIL   ibuprofen 200 MG tablet Commonly known as: ADVIL   predniSONE 10 MG tablet Commonly known as: DELTASONE Replaced by: predniSONE 10 MG (21) Tbpk tablet   ramipril 5 MG capsule Commonly known as: ALTACE     TAKE these medications   acetaminophen 325 MG tablet Commonly known as: TYLENOL Take 2 tablets (650 mg total) by mouth every 6 (six) hours as needed for mild pain or headache (fever >/= 101).   albuterol 108 (90 Base) MCG/ACT inhaler Commonly known as: VENTOLIN HFA Inhale 2-4 puffs by mouth every 4 hours as needed for wheezing, cough, and/or shortness of breath What changed:    how much to take  how to take this  when to take this  reasons to take this  additional instructions   Alcohol Wipes 70 % Pads 1 each by Does not apply route 4 (four) times daily. Use if for insulin administration   amLODipine 10 MG tablet Commonly known as: NORVASC Take 1 tablet (10 mg total) by mouth daily. What changed: Another medication with the same name was removed. Continue taking this medication, and follow the directions you see here.   blood glucose meter kit and supplies Dispense based on patient and insurance preference.  Use up to four times daily as directed. (FOR ICD-10 E10.9, E11.9).   chlorpheniramine-HYDROcodone 10-8 MG/5ML Suer Commonly known as: Tussionex Pennkinetic ER Take 5 mLs by mouth every 12 (twelve) hours as needed for cough.   famotidine 20 MG tablet Commonly known as: PEPCID Take 1 tablet (20 mg total) by mouth 2 (two) times daily.   guaiFENesin-dextromethorphan 100-10 MG/5ML syrup Commonly known as: ROBITUSSIN DM Take 10 mLs by mouth every 4 (four) hours as needed for cough.   insulin aspart 100 UNIT/ML FlexPen Commonly known as: NOVOLOG Inject units of insulin subcutaneously per capillary blood glucose (CBG) sugar levels indicated below:  CBG 70 - 120: 0 units CBG 121 - 150: 2 units CBG 151 - 200: 3 units CBG 201 - 250: 5 units CBG 251 - 300: 8 units CBG 301 - 350: 11 units CBG 351 - 400: 15 units CBG > 400: call your primary care provider for further instructions   insulin starter kit- pen needles Misc 1 kit by Other route once for 1 dose.   metFORMIN 500 MG tablet Commonly known as: GLUCOPHAGE TAKE 1 TABLET (500 MG TOTAL) BY MOUTH 2 (TWO) TIMES DAILY WITH A MEAL.   metoprolol succinate 50 MG 24 hr tablet Commonly known as: TOPROL-XL TAKE 1 TABLET (50 MG TOTAL) BY MOUTH DAILY. TAKE WITH OR IMMEDIATELY FOLLOWING A MEAL. What changed: See the new instructions.   predniSONE 10 MG (21) Tbpk tablet Commonly known as: STERAPRED  UNI-PAK 21 TAB Take 4 tablets (40 mg total) by mouth daily for 3 days, THEN 3 tablets (30 mg total) daily for 3 days, THEN 2 tablets (20 mg total) daily for 3 days, THEN 1 tablet (10 mg total) daily for 3 days. Start taking on: February 07, 2020 Replaces: predniSONE 10 MG tablet   sodium chloride 0.65 % Soln nasal spray Commonly known as: OCEAN Place 2 sprays into both nostrils as needed for congestion.            Durable Medical Equipment  (From admission, onward)         Start     Ordered   02/07/20 1144  DME Oxygen  Once       Comments: Dx COVID pneumonia with hypoxia  Question Answer Comment  Length of Need 6 Months   Mode or (Route) Nasal cannula   Liters per Minute 3   Frequency Continuous (stationary and portable oxygen unit needed)   Oxygen conserving device Yes   Oxygen delivery system Gas      02/07/20 1143   02/01/20 1407  For home use only DME oxygen  Once       Question Answer Comment  Length of Need 6 Months   Mode or (Route) Nasal cannula   Liters per Minute 4   Frequency Continuous (stationary and portable oxygen unit needed)   Oxygen conserving device Yes   Oxygen delivery system Gas      02/01/20 1407          Follow-up Information    Susy Frizzle, MD. Schedule an appointment as soon as possible for a visit in 1 week(s).   Specialty: Family Medicine Contact information: [address] [place] [address] [phone]              No Known Allergies  Consultations:  None   Procedures/Studies: CT Angio Chest PE W and/or Wo Contrast  Result Date: 01/28/2020 CLINICAL DATA:  Fatigue COVID elevated D-dimer EXAM: CT ANGIOGRAPHY CHEST  WITH CONTRAST TECHNIQUE: Multidetector CT imaging of the chest was performed using the standard protocol during bolus administration of intravenous contrast. Multiplanar CT image reconstructions and MIPs were obtained to evaluate the vascular anatomy. CONTRAST:  167m OMNIPAQUE IOHEXOL 350  MG/ML SOLN COMPARISON:  None. FINDINGS: Cardiovascular: Satisfactory opacification of the pulmonary arteries to the segmental level. No evidence of pulmonary embolism. Nonaneurysmal aorta. No dissection is seen. Cardiac size within normal limits. Mild coronary calcification. No pericardial effusion Mediastinum/Nodes: Midline trachea. No thyroid mass. Esophagus within normal limits. Prominent right paratracheal lymph node measuring 17 mm but with prominent fatty hilus. Lungs/Pleura: Fairly widespread bilateral ground-glass densities and patchy consolidations. No pleural effusion or pneumothorax Upper Abdomen: Hepatic steatosis.  No acute abnormality Musculoskeletal: No chest wall abnormality. No acute or significant osseous findings. Review of the MIP images confirms the above findings. IMPRESSION: 1. Negative for acute pulmonary embolus or aortic dissection. 2. Fairly widespread bilateral ground-glass densities and patchy consolidations, felt consistent with multifocal pneumonia and history of COVID positivity. 3. Hepatic steatosis. Electronically Signed   By: KDonavan FoilM.D.   On: 01/28/2020 20:45   DG Chest Port 1 View  Result Date: 01/31/2020 CLINICAL DATA:  COVID.  Decreased oxygen saturation. EXAM: PORTABLE CHEST 1 VIEW COMPARISON:  Chest CTA 3 days ago. FINDINGS: Upper normal heart size with normal mediastinal contours. Patchy heterogeneous bilateral airspace opacities in a mid-lower lung zone predominant distribution. There may be slight improvement from recent CT. No evidence of pneumomediastinum. No pneumothorax. No pleural fluid. Stable osseous structures. IMPRESSION: Patchy heterogeneous bilateral airspace opacities in a mid-lower lung zone predominant distribution consistent with COVID pneumonia. There may be slight improvement from recent CT. Electronically Signed   By: MKeith RakeM.D.   On: 01/31/2020 15:32       Subjective: He reports feeling much better today. He has improved O2  sats while at rest to 90-95%. He continues to have a cough. He required 3lpm via [place] of O2 supplementation while ambulating.   Discharge Exam: Vitals:   02/06/20 2023 02/07/20 0401  BP: (!) 141/91 (!) 152/83  Pulse: 89 (!) 59  Resp: 18 18  Temp: 98 F (36.7 C) 97.9 F (36.6 C)  SpO2: 91% 96%   Vitals:   02/06/20 1002 02/06/20 1337 02/06/20 2023 02/07/20 0401  BP: (!) 141/82 125/90 (!) 141/91 (!) 152/83  Pulse: 85 83 89 (!) 59  Resp:  (!) 24 18 18   Temp:  98.5 F (36.9 C) 98 F (36.7 C) 97.9 F (36.6 C)  TempSrc:  Oral    SpO2:  93% 91% 96%  Weight:      Height:        General: Pt is alert, awake, not in acute distress Cardiovascular: RRR, S1/S2 + Respiratory: Occasional dry cough, normal effort Abdominal: Soft, NT, ND Extremities: no edema, no cyanosis    The results of significant diagnostics from this hospitalization (including imaging, microbiology, ancillary and laboratory) are listed below for reference.     Microbiology: Recent Results (from the past 240 hour(s))  Blood Culture (routine x 2)     Status: None   Collection Time: 01/31/20  3:00 PM   Specimen: BLOOD  Result Value Ref Range Status   Specimen Description   Final    BLOOD RIGHT ANTECUBITAL Performed at [facility], [place] [address]   Special Requests   Final    BOTTLES DRAWN AEROBIC AND ANAEROBIC Blood Culture adequate volume Performed at  [facility], [place] [address], [place], [place] [address]    Culture   Final    NO GROWTH 5 DAYS Performed at [facility], [place] [address] [place], [place] [address]    Report Status 02/05/2020 FINAL  Final  Blood Culture (routine x 2)     Status: None   Collection Time: 01/31/20  3:00 PM   Specimen: BLOOD  Result Value Ref Range Status   Specimen Description   Final    BLOOD LEFT ANTECUBITAL Performed at [place] [address], [place], [place] [address]     Special Requests   Final    BOTTLES DRAWN AEROBIC AND ANAEROBIC Blood Culture adequate volume Performed at [facility] [address] [place], [place] [address]    Culture   Final    NO GROWTH 5 DAYS Performed at [facility], [place] [address] [place], [place] [address]    Report Status 02/05/2020 FINAL  Final  Resp Panel by RT-PCR (Flu A&B, Covid) Nasopharyngeal Swab     Status: Abnormal   Collection Time: 01/31/20  3:50 PM   Specimen: Nasopharyngeal Swab; Nasopharyngeal(NP) swabs in vial transport medium  Result Value Ref Range Status   SARS Coronavirus 2 by RT PCR POSITIVE (A) NEGATIVE Final    Comment: RESULT CALLED TO, READ BACK BY AND VERIFIED WITH: M.VENTER, PA AT 1800 ON 01/31/20 BY N.THOMPSON (NOTE) SARS-CoV-2 target nucleic acids are DETECTED.  The SARS-CoV-2 RNA is generally detectable in upper respiratory specimens during the acute phase of infection. Positive results are indicative of the presence of the identified virus, but do not rule out bacterial infection or co-infection with other pathogens not detected by the test. Clinical correlation with patient history and other diagnostic information is necessary to determine patient infection status. The expected result is Negative.  Fact Sheet for Patients: EntrepreneurPulse.com.au  Fact Sheet for Healthcare Providers: IncredibleEmployment.be  This test is not yet approved or cleared by the [place] FDA and  has been authorized for detection and/or diagnosis of SARS-CoV-2 by FDA under an Emergency Use Authorization (EUA).  This EUA will remain in effect (meaning thi s test can be used) for the duration of  the COVID-19 declaration under Section 564(b)(1) of the Act, 21 U.S.C. section 360bbb-3(b)(1), unless the authorization is terminated or revoked sooner.     Influenza A by PCR NEGATIVE NEGATIVE Final   Influenza B by PCR NEGATIVE NEGATIVE  Final    Comment: (NOTE) The Xpert Xpress SARS-CoV-2/FLU/RSV plus assay is intended as an aid in the diagnosis of influenza from Nasopharyngeal swab specimens and should not be used as a sole basis for treatment. Nasal washings and aspirates are unacceptable for Xpert Xpress SARS-CoV-2/FLU/RSV testing.  Fact Sheet for Patients: EntrepreneurPulse.com.au  Fact Sheet for Healthcare Providers: IncredibleEmployment.be  This test is not yet approved or cleared by the [place] FDA and has been authorized for detection and/or diagnosis of SARS-CoV-2 by FDA under an Emergency Use Authorization (EUA). This EUA will remain in effect (meaning this test can be used) for the duration of the COVID-19 declaration under Section 564(b)(1) of the Act, 21 U.S.C. section 360bbb-3(b)(1), unless the authorization is terminated or revoked.  Performed at [facility], [place] [address] [place], [place] [address]      Labs: BNP (last 3 results) No results for input(s): BNP in the last 8760 hours. Basic Metabolic Panel: Recent Labs  Lab 02/01/20 0556 02/02/20 0405  02/03/20 0333 02/04/20 0414 02/05/20 0338 02/06/20 1222 02/07/20 0440  NA 137 135 134* 135 136 133* 134*  K 4.7 4.7 5.5* 4.7 5.2* 4.5 4.9  CL 102 102 101 100 101 99 100  CO2 25 24 25 24 24 22 25   GLUCOSE 237* 223* 250* 167* 200* 210* 176*  BUN 19 20 22  24* 23 25* 32*  CREATININE 1.02 0.96 1.07 1.10 1.00 1.11 1.20  CALCIUM 8.8* 8.7* 8.7* 8.8* 9.1 8.9 8.8*  MG 2.4 2.2 2.1 2.2 2.5*  --   --   PHOS 4.1 3.5 4.1 4.1 4.6  --   --    Liver Function Tests: Recent Labs  Lab 02/03/20 0333 02/04/20 0414 02/05/20 0338 02/06/20 1222 02/07/20 0440  AST 25 23 20 20 20   ALT 41 38 38 40 34  ALKPHOS 50 48 51 55 50  BILITOT 0.8 0.9 0.8 0.9 0.8  PROT 5.8* 5.9* 6.1* 7.1 5.9*  ALBUMIN 3.1* 3.1* 3.2* 3.6 3.1*   No results for input(s): LIPASE, AMYLASE in the last 168 hours. No results  for input(s): AMMONIA in the last 168 hours. CBC: Recent Labs  Lab 02/02/20 0405 02/03/20 0333 02/04/20 0414 02/05/20 0338 02/06/20 1222 02/07/20 0440  WBC 8.8 9.6 10.0 9.9 16.1* 10.8*  NEUTROABS 7.9* 8.5* 8.3* 8.8* 14.5*  --   HGB 14.3 14.6 14.7 15.4 16.9 15.6  HCT 40.4 41.7 41.8 43.2 47.4 44.8  MCV 93.1 94.8 93.7 93.7 91.9 94.1  PLT 246 267 273 308 467* 298   Cardiac Enzymes: No results for input(s): CKTOTAL, CKMB, CKMBINDEX, TROPONINI in the last 168 hours. BNP: Invalid input(s): POCBNP CBG: Recent Labs  Lab 02/06/20 0748 02/06/20 1143 02/06/20 1659 02/06/20 2120 02/07/20 0744  GLUCAP 237* 231* 249* 214* 181*   D-Dimer Recent Labs    02/06/20 1222 02/07/20 0440  DDIMER 0.61* 0.56*   Hgb A1c No results for input(s): HGBA1C in the last 72 hours. Lipid Profile No results for input(s): CHOL, HDL, LDLCALC, TRIG, CHOLHDL, LDLDIRECT in the last 72 hours. Thyroid function studies No results for input(s): TSH, T4TOTAL, T3FREE, THYROIDAB in the last 72 hours.  Invalid input(s): FREET3 Anemia work up [employer]    02/05/20 0338 02/07/20 0440  FERRITIN 709* 756*   Urinalysis No results found for: COLORURINE, APPEARANCEUR, LABSPEC, [place], GLUCOSEU, [place], BILIRUBINUR, [place], [place], UROBILINOGEN, NITRITE, LEUKOCYTESUR Sepsis Labs Invalid input(s): PROCALCITONIN,  WBC,  LACTICIDVEN Microbiology Recent Results (from the past 240 hour(s))  Blood Culture (routine x 2)     Status: None   Collection Time: 01/31/20  3:00 PM   Specimen: BLOOD  Result Value Ref Range Status   Specimen Description   Final    BLOOD RIGHT ANTECUBITAL Performed at [place] [address], [place], [place] [address]    Special Requests   Final    BOTTLES DRAWN AEROBIC AND ANAEROBIC Blood Culture adequate volume Performed at [facility] [address] [place], [place] [address]    Culture   Final    NO GROWTH 5 DAYS Performed at  [facility], [place] [address] [place], [place] [address]    Report Status 02/05/2020 FINAL  Final  Blood Culture (routine x 2)     Status: None   Collection Time: 01/31/20  3:00 PM   Specimen: BLOOD  Result Value Ref Range Status   Specimen Description   Final    BLOOD LEFT ANTECUBITAL Performed at [facility]  [address] [place], [place] [address]    Special Requests   Final    BOTTLES DRAWN AEROBIC AND ANAEROBIC Blood Culture adequate volume Performed at [facility] [address], [place], [place] [address]    Culture   Final    NO GROWTH 5 DAYS Performed at [facility], [place] [address], [place], [place] [address]    Report Status 02/05/2020 FINAL  Final  Resp Panel by RT-PCR (Flu A&B, Covid) Nasopharyngeal Swab     Status: Abnormal   Collection Time: 01/31/20  3:50 PM   Specimen: Nasopharyngeal Swab; Nasopharyngeal(NP) swabs in vial transport medium  Result Value Ref Range Status   SARS Coronavirus 2 by RT PCR POSITIVE (A) NEGATIVE Final    Comment: RESULT CALLED TO, READ BACK BY AND VERIFIED WITH: M.VENTER, PA AT 1800 ON 01/31/20 BY N.THOMPSON (NOTE) SARS-CoV-2 target nucleic acids are DETECTED.  The SARS-CoV-2 RNA is generally detectable in upper respiratory specimens during the acute phase of infection. Positive results are indicative of the presence of the identified virus, but do not rule out bacterial infection or co-infection with other pathogens not detected by the test. Clinical correlation with patient history and other diagnostic information is necessary to determine patient infection status. The expected result is Negative.  Fact Sheet for Patients: EntrepreneurPulse.com.au  Fact Sheet for Healthcare Providers: IncredibleEmployment.be  This test is not yet approved or cleared by the [place] FDA and  has been authorized for detection and/or diagnosis of  SARS-CoV-2 by FDA under an Emergency Use Authorization (EUA).  This EUA will remain in effect (meaning thi s test can be used) for the duration of  the COVID-19 declaration under Section 564(b)(1) of the Act, 21 U.S.C. section 360bbb-3(b)(1), unless the authorization is terminated or revoked sooner.     Influenza A by PCR NEGATIVE NEGATIVE Final   Influenza B by PCR NEGATIVE NEGATIVE Final    Comment: (NOTE) The Xpert Xpress SARS-CoV-2/FLU/RSV plus assay is intended as an aid in the diagnosis of influenza from Nasopharyngeal swab specimens and should not be used as a sole basis for treatment. Nasal washings and aspirates are unacceptable for Xpert Xpress SARS-CoV-2/FLU/RSV testing.  Fact Sheet for Patients: EntrepreneurPulse.com.au  Fact Sheet for Healthcare Providers: IncredibleEmployment.be  This test is not yet approved or cleared by the [place] FDA and has been authorized for detection and/or diagnosis of SARS-CoV-2 by FDA under an Emergency Use Authorization (EUA). This EUA will remain in effect (meaning this test can be used) for the duration of the COVID-19 declaration under Section 564(b)(1) of the Act, 21 U.S.C. section 360bbb-3(b)(1), unless the authorization is terminated or revoked.  Performed at [facility], [place] [address] [place], [place] [address]      Time coordinating discharge: Over 33 minutes  SIGNED:   Blain Pais, MD  [facility] 02/07/2020, 11:46 AM Pager   If 7PM-7AM, please contact night-coverage [email or web address] Password [email or web address]

## 2020-02-07 NOTE — Progress Notes (Signed)
SATURATION QUALIFICATIONS: (This note is used to comply with regulatory documentation for home oxygen)  Patient Saturations on Room Air at Rest = 91%  Patient Saturations on Room Air while Ambulating =84%  Patient Saturations on 3Liters of oxygen while Ambulating = 91%  Please briefly explain why patient needs home oxygen: Oxygen desats while patient is ambulating. Diagnosis is COVID.

## 2020-02-07 NOTE — TOC Transition Note (Signed)
Transition of Care [facility]) - CM/SW Discharge Note   Patient Details  Name: Andrew Cross MRN: [id number] Date of Birth: [birth year]  Transition of Care [facility] CM/SW Contact:  Ida Rogue, LCSW Phone Number: 02/07/2020, 11:05 AM   Clinical Narrative:   Patient who is scheduled for d/c today is in need of home O2, has a ride home.  SAT note and orders seen and appreciated.  Contacted Morrie Sheldon with [employer] who will arrange for delivery of home unit, travel cannister.  No further needs identified.  TOC sign off.    Final next level of care: Home/Self Care Barriers to Discharge: Barriers Resolved   Patient Goals and CMS Choice Patient states their goals for this hospitalization and ongoing recovery are:: i want to go home CMS Medicare.gov Compare Post Acute Care list provided to:: Patient Choice offered to / list presented to : Patient  Discharge Placement                       Discharge Plan and Services In-house Referral: Clinical Social Work Discharge Planning Services: CM Consult Post Acute Care Choice: Durable Medical Equipment                               Social Determinants of Health (SDOH) Interventions     Readmission Risk Interventions No flowsheet data found.

## 2020-02-07 NOTE — Plan of Care (Signed)
  Problem: Coping: Goal: Psychosocial and spiritual needs will be supported Outcome: Progressing   

## 2020-02-15 ENCOUNTER — Inpatient Hospital Stay: Payer: 59 | Admitting: Family Medicine

## 2020-02-22 ENCOUNTER — Ambulatory Visit (INDEPENDENT_AMBULATORY_CARE_PROVIDER_SITE_OTHER): Payer: 59 | Admitting: Family Medicine

## 2020-02-22 ENCOUNTER — Other Ambulatory Visit: Payer: Self-pay

## 2020-02-22 VITALS — BP 152/88 | HR 96 | Temp 98.4°F | Resp 18 | Wt 241.0 lb

## 2020-02-22 DIAGNOSIS — U071 COVID-19: Secondary | ICD-10-CM | POA: Diagnosis not present

## 2020-02-22 DIAGNOSIS — E118 Type 2 diabetes mellitus with unspecified complications: Secondary | ICD-10-CM

## 2020-02-22 DIAGNOSIS — I10 Essential (primary) hypertension: Secondary | ICD-10-CM | POA: Diagnosis not present

## 2020-02-22 MED ORDER — GLUCOSE BLOOD VI STRP: ORAL_STRIP | 12 refills | Status: DC

## 2020-02-22 MED ORDER — RAMIPRIL 10 MG PO CAPS: 10.0000 mg | ORAL_CAPSULE | Freq: Every day | ORAL | 3 refills | Status: DC

## 2020-02-22 MED ORDER — METFORMIN HCL ER 500 MG PO TB24: 2000.0000 mg | ORAL_TABLET | Freq: Every day | ORAL | 5 refills | Status: DC

## 2020-02-22 NOTE — Progress Notes (Signed)
Subjective:    Patient ID: Andrew Cross, male    DOB: 04/02/1955, 65 y.o.   MRN: [id number]  HPI  Admit date: 01/31/2020 Discharge date: 02/07/2020  Admitted From: Home Disposition:  Home   Recommendations for Outpatient Follow-up:  1. Follow up with PCP in 1-2 weeks   Home Health:No Equipment/Devices:Yes, Oxygen  Discharge Condition:Stable CODE STATUS:Full Diet recommendation: Carb mod  Brief/Interim Summary: He has much improved in the past 48 hours after receiving 1 dose of Actemra. His O2 sats improved at rest but he continues to require 3lpm via [place] of O2 supplementation with ambulation.  His blood sugars are still elevated but he is on steroid treatment. He will have a prednisone taper at the time of discharge.  He was given prescription for a glucometer with strips and supplies and novolog pen for use at home. The diabetes educator was consulted prior to his discharge and he received teaching on insulin use and administration. He will continue metformin, a medication he had tolerated well pta. He will follow up with his primary care provider for continued monitoring of his COVID recovery and for monitoring of his diabetes.    Please see below for further details for this hospital course:  COVID pneumonia Acute respiratory failure with hypoxia  -Presented with shortness of breath, hypoxia -COVID test: Positive as an outpatient -CTA chest on admission showed patchy pneumonia  -Treatment: Patient completed a course of remdesivir today1/5. He is also on Solu-Medrol 40 mg twice daily.   -on 1/4:Patient states he feels better but is still hypoxic and worsens on minimal exertion. He is informed the markers are worsening as well. I had a long conversation with patient and his family. Decision was made to give a dose of Actemra today. Currently on 5 to 6 L oxygen at rest. Wean down as tolerated.  On  02/06/20 CRP trended down overnight, WBC trended up. He  feels better with apparent improvement of his hypoxia.  Will need walk test. Unclear if he will be ready for discharge. Given the elevated WBC with recent Actemra treatment, will need continued monitoring for possible superimposed bacterial pneumonia.   The plan is to continue: -Supportive care: Vitamin C, Zinc, PRN inhalers, Tylenol, Antitussives (benzonatate/ Mucinex/Tussionex).  -Encouraged incentive spirometry, prone position, out of bed and early mobilization as much as possible -Continue airborne/contact isolation precautions for duration of 3 weeks from the day of diagnosis. -repeat labs in am.      Last Labs               Recent Labs  Lab 01/31/20 1437 01/31/20 1550 01/31/20 1745 02/01/20 0556 02/02/20 0405 02/03/20 0333 02/04/20 0414 02/05/20 0338 02/06/20 1222  SARSCOV2NAA --  POSITIVE* --  --  --  --  --  --  --   WBC 6.8 --  --  6.0 8.8 9.6 10.0 9.9 16.1*  LATICACIDVEN 1.8 --  1.8 --  --  --  --  --  --   PROCALCITON <0.10 --  --  --  --  --  --  --  --   DDIMER 0.54* --  --  --  0.50 0.43 0.60* 0.48 0.61*  FERRITIN 1,067* --  --  849* 832* 705* 713* 709* --   LDH 481* --  --  --  --  --  --  --  --   CRP 5.3* --  --  4.7* 2.3* 2.0* 3.3* 6.5* 2.3*  ALT 51* --  --  46*  43 41 38 38 40     Transaminitis -likely due to Covid infection -Improved Last Labs          Recent Labs  Lab 02/02/20 0405 02/03/20 0333 02/04/20 0414 02/05/20 0338 02/06/20 1222  AST 32 25 23 20 20   ALT 43 41 38 38 40  ALKPHOS 48 50 48 51 55  BILITOT 1.0 0.8 0.9 0.8 0.9  PROT 5.9* 5.8* 5.9* 6.1* 7.1  ALBUMIN 3.1* 3.1* 3.1* 3.2* 3.6     Type 2 diabetes mellitus -A1c 7.9 on 12/31. -On Metformin at home which is currently on hold. -Currently on 5 units Lantus daily and scheduled Premeal NovoLog 4 units 3 times daily. -Blood sugar still remains elevated. Diabetes care coordinator consulted. -I increase Lantus to 7 units and  premeal NovoLog to 6 units 3 times daily today -Continue sliding scale insulin with Accu-Cheks.  02/06/20 Blood sugar remains elevated, increased Lantus to 10 units hs Last Labs          Recent Labs  Lab 02/05/20 1716 02/05/20 2111 02/06/20 0748 02/06/20 1143 02/06/20 1659  GLUCAP 235* 188* 237* 231* 249*     Hypertension -Currently continued home medication Toprol-XL, ramipril,, Norvasc. Ramipril on hold. -Blood pressure elevated in 140s. Partially secondary to IV steroids. Continue to monitor.  Hyperlipidemia -Reports PCP prescribed statin,but patient prefers not to take it.   GERD Discontinued Maalox due to recent elevated magnesium level.  Start famotidine.    Discharge Diagnoses:  Principal Problem:   Acute hypoxemic respiratory failure due to COVID-19 [facility]) Active Problems:   Hypertension associated with diabetes ([place])   Diabetes mellitus type 2 with complications ([place])   Hyperlipidemia associated with type 2 diabetes mellitus ([place])  Patient was able to quit oxygen 2 or 3 nights ago.  He states that his oxygen levels consistently 96 to 97% on room air.  He denies any chest pain or shortness of breath.  He quit taking his prednisone about 2 days ago.  He states that his blood sugars have all been under 200.  The lowest sugar he is seen has been 97.  He denies any polyuria polydipsia or blurry vision.  He denies any pleurisy.  He denies any hemoptysis.  He denies any shortness of breath.  He denies any unilateral leg edema.  Blood pressure today is elevated but he is no longer taking his ramipril. Past Medical History:  Diagnosis Date   Diabetes mellitus type 2 with complications ([place])    H/O: gout    HLD (hyperlipidemia)    Hypertension    Past Surgical History:  Procedure Laterality Date   FRACTURE SURGERY  1970 / 1980   left forearm / right ankle   VASECTOMY  1993   Current Outpatient Medications on File Prior to Visit  Medication  Sig Dispense Refill   acetaminophen (TYLENOL) 325 MG tablet Take 2 tablets (650 mg total) by mouth every 6 (six) hours as needed for mild pain or headache (fever >/= 101).     albuterol (VENTOLIN HFA) 108 (90 Base) MCG/ACT inhaler Inhale 2-4 puffs by mouth every 4 hours as needed for wheezing, cough, and/or shortness of breath (Patient taking differently: Inhale 2 puffs into the lungs every 6 (six) hours as needed for shortness of breath or wheezing.) 6.7 g 0   Alcohol Swabs (ALCOHOL WIPES) 70 % PADS 1 each by Does not apply route 4 (four) times daily. Use if for insulin administration 100 each 0   amLODipine (NORVASC) 10  MG tablet Take 1 tablet (10 mg total) by mouth daily. 90 tablet 3   blood glucose meter kit and supplies Dispense based on patient and insurance preference. Use up to four times daily as directed. (FOR ICD-10 E10.9, E11.9). 1 each 0   chlorpheniramine-HYDROcodone (TUSSIONEX PENNKINETIC ER) 10-8 MG/5ML SUER Take 5 mLs by mouth every 12 (twelve) hours as needed for cough. (Patient not taking: Reported on 01/31/2020) 115 mL 0   famotidine (PEPCID) 20 MG tablet Take 1 tablet (20 mg total) by mouth 2 (two) times daily. 60 tablet 0   guaiFENesin-dextromethorphan (ROBITUSSIN DM) 100-10 MG/5ML syrup Take 10 mLs by mouth every 4 (four) hours as needed for cough. 118 mL 0   insulin aspart (NOVOLOG) 100 UNIT/ML FlexPen Inject units of insulin subcutaneously per capillary blood glucose (CBG) sugar levels indicated below:  CBG 70 - 120: 0 units CBG 121 - 150: 2 units CBG 151 - 200: 3 units CBG 201 - 250: 5 units CBG 251 - 300: 8 units CBG 301 - 350: 11 units CBG 351 - 400: 15 units CBG > 400: call your primary care provider for further instructions 15 mL 11   metFORMIN (GLUCOPHAGE) 500 MG tablet TAKE 1 TABLET (500 MG TOTAL) BY MOUTH 2 (TWO) TIMES DAILY WITH A MEAL. 180 tablet 1   metoprolol succinate (TOPROL-XL) 50 MG 24 hr tablet TAKE 1 TABLET (50 MG TOTAL) BY MOUTH DAILY. TAKE  WITH OR IMMEDIATELY FOLLOWING A MEAL. (Patient taking differently: Take 50 mg by mouth daily.) 90 tablet 3   sodium chloride (OCEAN) 0.65 % SOLN nasal spray Place 2 sprays into both nostrils as needed for congestion.  0   No current facility-administered medications on file prior to visit.    No Known Allergies Social History   Socioeconomic History   Marital status: Married    Spouse name: Not on file   Number of children: Not on file   Years of education: Not on file   Highest education level: Not on file  Occupational History   Not on file  Tobacco Use   Smoking status: Never Smoker   Smokeless tobacco: Never Used  Substance and Sexual Activity   Alcohol use: Not Currently    Comment: 2 mixed drinks a month   Drug use: No   Sexual activity: Not Currently    Birth control/protection: Surgical  Other Topics Concern   Not on file  Social History Narrative   Not on file   Social Determinants of Health   Financial Resource Strain: Not on file  Food Insecurity: Not on file  Transportation Needs: Not on file  Physical Activity: Not on file  Stress: Not on file  Social Connections: Not on file  Intimate Partner Violence: Not on file   Family History  Problem Relation Age of Onset   Hypertension Mother    Hypertension Father    Hypertension Sister    Arthritis Maternal Grandmother    Hypertension Maternal Grandmother    Hypertension Maternal Grandfather    Hypertension Paternal Grandmother    Alcohol abuse Paternal Grandfather    Hypertension Paternal Grandfather       Review of Systems     Objective:   Physical Exam Vitals reviewed.  Constitutional:      General: He is not in acute distress.    Appearance: He is well-developed. He is not diaphoretic.  HENT:     Head: Normocephalic and atraumatic.     Right Ear: External ear normal.  Left Ear: External ear normal.     Nose: Nose normal.     Mouth/Throat:     Pharynx: No  oropharyngeal exudate.  Eyes:     General: No scleral icterus.       Right eye: No discharge.        Left eye: No discharge.     Conjunctiva/sclera: Conjunctivae normal.     Pupils: Pupils are equal, round, and reactive to light.  Neck:     Thyroid: No thyromegaly.     Vascular: No JVD.     Trachea: No tracheal deviation.  Cardiovascular:     Rate and Rhythm: Normal rate and regular rhythm.     Heart sounds: Normal heart sounds. No murmur heard. No friction rub. No gallop.   Pulmonary:     Effort: Pulmonary effort is normal. No respiratory distress.     Breath sounds: Normal breath sounds. No stridor. No wheezing or rales.  Chest:     Chest wall: No tenderness.  Abdominal:     General: Bowel sounds are normal. There is no distension.     Palpations: Abdomen is soft. There is no mass.     Tenderness: There is no abdominal tenderness. There is no guarding or rebound.  Musculoskeletal:        General: No tenderness. Normal range of motion.     Cervical back: Normal range of motion and neck supple.  Lymphadenopathy:     Cervical: No cervical adenopathy.  Skin:    General: Skin is warm.     Coloration: Skin is not pale.     Findings: No erythema or rash.  Neurological:     Mental Status: He is alert and oriented to person, place, and time.     Cranial Nerves: No cranial nerve deficit.     Motor: No abnormal muscle tone.     Coordination: Coordination normal.     Deep Tendon Reflexes: Reflexes are normal and symmetric.  Psychiatric:        Behavior: Behavior normal.        Thought Content: Thought content normal.        Judgment: Judgment normal.           Assessment & Plan:  COVID - Plan: CBC with Differential/Platelet, COMPLETE METABOLIC PANEL WITH GFR  Diabetes mellitus type 2 with complications (HCC)  Benign essential HTN  Blood pressure is extremely high today.  Resume ramipril 10 mg a day and recheck blood pressure in 2 weeks.  Obtain a baseline CBC and CMP  today to follow-up for any electrolyte disturbances including his transaminitis from his COVID.  Clinically he seems to be recovered well from [place].  He is no longer hypoxic and his pulmonary exam today is normal.  Therefore I have asked the patient to resume aspirin 81 mg daily.  Regarding his diabetes, I explained that his A1c was 7.9.  He admits that he is only been taking metformin 500 mg a day in the morning.  He is now willing to take the metformin as prescribed and to eat a more low carbohydrate diet.  I will increase his metformin to 1000 g twice a day and then recheck fasting lab work in 3 months.

## 2020-02-23 LAB — CBC WITH DIFFERENTIAL/PLATELET
Absolute Monocytes: 490 cells/uL (ref 200–950)
Basophils Absolute: 39 cells/uL (ref 0–200)
Basophils Relative: 0.7 %
Eosinophils Absolute: 270 cells/uL (ref 15–500)
Eosinophils Relative: 4.9 %
HCT: 43.4 % (ref 38.5–50.0)
Hemoglobin: 15.8 g/dL (ref 13.2–17.1)
Lymphs Abs: 1925 cells/uL (ref 850–3900)
MCH: 34.8 pg — ABNORMAL HIGH (ref 27.0–33.0)
MCHC: 36.4 g/dL — ABNORMAL HIGH (ref 32.0–36.0)
MCV: 95.6 fL (ref 80.0–100.0)
MPV: 11 fL (ref 7.5–12.5)
Monocytes Relative: 8.9 %
Neutro Abs: 2778 cells/uL (ref 1500–7800)
Neutrophils Relative %: 50.5 %
Platelets: 112 10*3/uL — ABNORMAL LOW (ref 140–400)
RBC: 4.54 10*6/uL (ref 4.20–5.80)
RDW: 14 % (ref 11.0–15.0)
Total Lymphocyte: 35 %
WBC: 5.5 10*3/uL (ref 3.8–10.8)

## 2020-02-23 LAB — COMPLETE METABOLIC PANEL WITH GFR
AG Ratio: 2.5 (calc) (ref 1.0–2.5)
ALT: 51 U/L — ABNORMAL HIGH (ref 9–46)
AST: 30 U/L (ref 10–35)
Albumin: 4 g/dL (ref 3.6–5.1)
Alkaline phosphatase (APISO): 78 U/L (ref 35–144)
BUN: 17 mg/dL (ref 7–25)
CO2: 26 mmol/L (ref 20–32)
Calcium: 9 mg/dL (ref 8.6–10.3)
Chloride: 106 mmol/L (ref 98–110)
Creat: 0.96 mg/dL (ref 0.70–1.25)
GFR, Est African American: 98 mL/min/{1.73_m2} (ref >=60)
GFR, Est Non African American: 84 mL/min/{1.73_m2} (ref >=60)
Globulin: 1.6 g/dL (calc) — ABNORMAL LOW (ref 1.9–3.7)
Glucose, Bld: 123 mg/dL — ABNORMAL HIGH (ref 65–99)
Potassium: 4.6 mmol/L (ref 3.5–5.3)
Sodium: 141 mmol/L (ref 135–146)
Total Bilirubin: 0.6 mg/dL (ref 0.2–1.2)
Total Protein: 5.6 g/dL — ABNORMAL LOW (ref 6.1–8.1)

## 2020-05-23 ENCOUNTER — Encounter: Payer: Self-pay | Admitting: Family Medicine

## 2020-05-23 ENCOUNTER — Other Ambulatory Visit: Payer: Self-pay

## 2020-05-23 ENCOUNTER — Ambulatory Visit (INDEPENDENT_AMBULATORY_CARE_PROVIDER_SITE_OTHER): Payer: 59 | Admitting: Family Medicine

## 2020-05-23 VITALS — BP 148/88 | HR 82 | Temp 98.4°F | Resp 14 | Ht 71.0 in | Wt 246.0 lb

## 2020-05-23 DIAGNOSIS — E1169 Type 2 diabetes mellitus with other specified complication: Secondary | ICD-10-CM

## 2020-05-23 DIAGNOSIS — I10 Essential (primary) hypertension: Secondary | ICD-10-CM | POA: Diagnosis not present

## 2020-05-23 DIAGNOSIS — E785 Hyperlipidemia, unspecified: Secondary | ICD-10-CM

## 2020-05-23 DIAGNOSIS — E118 Type 2 diabetes mellitus with unspecified complications: Secondary | ICD-10-CM

## 2020-05-23 MED ORDER — SILDENAFIL CITRATE 100 MG PO TABS: 50.0000 mg | ORAL_TABLET | Freq: Every day | ORAL | 11 refills | Status: AC | PRN

## 2020-05-23 NOTE — Progress Notes (Signed)
Subjective:    Patient ID: Andrew Cross, male    DOB: [birth year], 65 y.o.   MRN: [id number]  HPI  Admit date: 01/31/2020 Discharge date: 02/07/2020  Admitted From: Home Disposition:  Home   Recommendations for Outpatient Follow-up:  1. Follow up with PCP in 1-2 weeks   Home Health:No Equipment/Devices:Yes, Oxygen  Discharge Condition:Stable CODE STATUS:Full Diet recommendation: Carb mod  Brief/Interim Summary: He has much improved in the past 48 hours after receiving 1 dose of Actemra. His O2 sats improved at rest but he continues to require 3lpm via [place] of O2 supplementation with ambulation.  His blood sugars are still elevated but he is on steroid treatment. He will have a prednisone taper at the time of discharge.  He was given prescription for a glucometer with strips and supplies and novolog pen for use at home. The diabetes educator was consulted prior to his discharge and he received teaching on insulin use and administration. He will continue metformin, a medication he had tolerated well pta. He will follow up with his primary care provider for continued monitoring of his COVID recovery and for monitoring of his diabetes.    Please see below for further details for this hospital course:  COVID pneumonia Acute respiratory failure with hypoxia  -Presented with shortness of breath, hypoxia -COVID test: Positive as an outpatient -CTA chest on admission showed patchy pneumonia  -Treatment: Patient completed a course of remdesivir today1/5. He is also on Solu-Medrol 40 mg twice daily.   -on 1/4:Patient states he feels better but is still hypoxic and worsens on minimal exertion. He is informed the markers are worsening as well. I had a long conversation with patient and his family. Decision was made to give a dose of Actemra today. Currently on 5 to 6 L oxygen at rest. Wean down as tolerated.  On  02/06/20 CRP trended down overnight, WBC trended up. He  feels better with apparent improvement of his hypoxia.  Will need walk test. Unclear if he will be ready for discharge. Given the elevated WBC with recent Actemra treatment, will need continued monitoring for possible superimposed bacterial pneumonia.   The plan is to continue: -Supportive care: Vitamin C, Zinc, PRN inhalers, Tylenol, Antitussives (benzonatate/ Mucinex/Tussionex).  -Encouraged incentive spirometry, prone position, out of bed and early mobilization as much as possible -Continue airborne/contact isolation precautions for duration of 3 weeks from the day of diagnosis. -repeat labs in am.      Last Labs               Recent Labs  Lab 01/31/20 1437 01/31/20 1550 01/31/20 1745 02/01/20 0556 02/02/20 0405 02/03/20 0333 02/04/20 0414 02/05/20 0338 02/06/20 1222  SARSCOV2NAA --  POSITIVE* --  --  --  --  --  --  --   WBC 6.8 --  --  6.0 8.8 9.6 10.0 9.9 16.1*  LATICACIDVEN 1.8 --  1.8 --  --  --  --  --  --   PROCALCITON <0.10 --  --  --  --  --  --  --  --   DDIMER 0.54* --  --  --  0.50 0.43 0.60* 0.48 0.61*  FERRITIN 1,067* --  --  849* 832* 705* 713* 709* --   LDH 481* --  --  --  --  --  --  --  --   CRP 5.3* --  --  4.7* 2.3* 2.0* 3.3* 6.5* 2.3*  ALT 51* --  --  46*  43 41 38 38 40     Transaminitis -likely due to Covid infection -Improved Last Labs          Recent Labs  Lab 02/02/20 0405 02/03/20 0333 02/04/20 0414 02/05/20 0338 02/06/20 1222  AST 32 _0 ALT 43 41 38 38 40  ALKPHOS 48 50 48 51 55  BILITOT 1.0 0.8 0.9 0.8 0.9  PROT 5.9* 5.8* 5.9* 6.1* 7.1  ALBUMIN 3.1* 3.1* 3.1* 3.2* 3.6     Type 2 diabetes mellitus -A1c 7.9 on 12/31. -On Metformin at home which is currently on hold. -Currently on 5 units Lantus daily and scheduled Premeal NovoLog 4 units 3 times daily. -Blood sugar still remains elevated. Diabetes care coordinator consulted. -I increase Lantus to 7 units and  premeal NovoLog to 6 units 3 times daily today -Continue sliding scale insulin with Accu-Cheks.  02/06/20 Blood sugar remains elevated, increased Lantus to 10 units hs Last Labs          Recent Labs  Lab 02/05/20 1716 02/05/20 2111 02/06/20 0748 02/06/20 1143 02/06/20 1659  GLUCAP 235* 188* 237* 231* 249*     Hypertension -Currently continued home medication Toprol-XL, ramipril,, Norvasc. Ramipril on hold. -Blood pressure elevated in 140s. Partially secondary to IV steroids. Continue to monitor.  Hyperlipidemia -Reports PCP prescribed statin,but patient prefers not to take it.   GERD Discontinued Maalox due to recent elevated magnesium level.  Start famotidine.    Discharge Diagnoses:  Principal Problem:   Acute hypoxemic respiratory failure due to COVID-19 [name]) Active Problems:   Hypertension associated with diabetes ([place])   Diabetes mellitus type 2 with complications ([name])   Hyperlipidemia associated with type 2 diabetes mellitus ([name])  02/22/20 Patient was able to quit oxygen 2 or 3 nights ago.  He states that his oxygen levels consistently 96 to 97% on room air.  He denies any chest pain or shortness of breath.  He quit taking his prednisone about 2 days ago.  He states that his blood sugars have all been under 200.  The lowest sugar he is seen has been 97.  He denies any polyuria polydipsia or blurry vision.  He denies any pleurisy.  He denies any hemoptysis.  He denies any shortness of breath.  He denies any unilateral leg edema.  Blood pressure today is elevated but he is no longer taking his ramipril.  At that time, my plan was: Blood pressure is extremely high today.  Resume ramipril 10 mg a day and recheck blood pressure in 2 weeks.  Obtain a baseline CBC and CMP today to follow-up for any electrolyte disturbances including his transaminitis from his COVID.  Clinically he seems to be recovered well from [name].  He is no longer hypoxic and his  pulmonary exam today is normal.  Therefore I have asked the patient to resume aspirin 81 mg daily.  Regarding his diabetes, I explained that his A1c was 7.9.  He admits that he is only been taking metformin 500 mg a day in the morning.  He is now willing to take the metformin as prescribed and to eat a more low carbohydrate diet.  I will increase his metformin to 1000 g twice a day and then recheck fasting lab work in 3 months.  05/23/20 Patient is here today to recheck his blood sugar.  He states that he is been checking his sugar consistently and his average blood sugar is 109!  This is much better than before.  He denies any polyuria, polydipsia, or blurry vision.  He does report some occasional erectile dysfunction and would like a prescription for Viagra.  He denies any claudication.  Diabetic foot exam was performed today and is completely normal.  He has excellent peripheral pulses.  However his blood pressure today is elevated.  He has not been checking his blood pressure at home consistently. Past Medical History:  Diagnosis Date  . Diabetes mellitus type 2 with complications ([place])   . H/O: gout   . HLD (hyperlipidemia)   . Hypertension    Past Surgical History:  Procedure Laterality Date  . FRACTURE SURGERY  1970 / 1980   left forearm / right ankle  . VASECTOMY  1993   Current Outpatient Medications on File Prior to Visit  Medication Sig Dispense Refill  . albuterol (VENTOLIN HFA) 108 (90 Base) MCG/ACT inhaler Inhale 2-4 puffs by mouth every 4 hours as needed for wheezing, cough, and/or shortness of breath (Patient not taking: Reported on 02/22/2020) 6.7 g 0  . amLODipine (NORVASC) 10 MG tablet Take 1 tablet (10 mg total) by mouth daily. 90 tablet 3  . blood glucose meter kit and supplies Dispense based on patient and insurance preference. Use up to four times daily as directed. (FOR ICD-10 E10.9, E11.9). 1 each 0  . chlorpheniramine-HYDROcodone (TUSSIONEX PENNKINETIC ER) 10-8 MG/5ML  SUER Take 5 mLs by mouth every 12 (twelve) hours as needed for cough. (Patient not taking: No sig reported) 115 mL 0  . famotidine (PEPCID) 20 MG tablet Take 1 tablet (20 mg total) by mouth 2 (two) times daily. (Patient not taking: Reported on 02/22/2020) 60 tablet 0  . glucose blood test strip Use as instructed 100 each 12  . insulin aspart (NOVOLOG) 100 UNIT/ML FlexPen Inject units of insulin subcutaneously per capillary blood glucose (CBG) sugar levels indicated below:  CBG 70 - 120: 0 units CBG 121 - 150: 2 units CBG 151 - 200: 3 units CBG 201 - 250: 5 units CBG 251 - 300: 8 units CBG 301 - 350: 11 units CBG 351 - 400: 15 units CBG > 400: call your primary care provider for further instructions 15 mL 11  . metFORMIN (GLUCOPHAGE XR) 500 MG 24 hr tablet Take 4 tablets (2,000 mg total) by mouth daily with breakfast. 120 tablet 5  . metFORMIN (GLUCOPHAGE) 500 MG tablet TAKE 1 TABLET (500 MG TOTAL) BY MOUTH 2 (TWO) TIMES DAILY WITH A MEAL. 180 tablet 1  . metoprolol succinate (TOPROL-XL) 50 MG 24 hr tablet TAKE 1 TABLET (50 MG TOTAL) BY MOUTH DAILY. TAKE WITH OR IMMEDIATELY FOLLOWING A MEAL. (Patient taking differently: Take 50 mg by mouth daily.) 90 tablet 3  . ramipril (ALTACE) 10 MG capsule Take 1 capsule (10 mg total) by mouth daily. 90 capsule 3  . sodium chloride (OCEAN) 0.65 % SOLN nasal spray Place 2 sprays into both nostrils as needed for congestion. (Patient not taking: Reported on 02/22/2020)  0   No current facility-administered medications on file prior to visit.    No Known Allergies Social History   Socioeconomic History  . Marital status: Married    Spouse name: Not on file  . Number of children: Not on file  . Years of education: Not on file  . Highest education level: Not on file  Occupational History  . Not on file  Tobacco Use  . Smoking status: Never Smoker  . Smokeless tobacco: Never Used  Substance and Sexual Activity  . Alcohol  use: Not Currently    Comment:  2 mixed drinks a month  . Drug use: No  . Sexual activity: Not Currently    Birth control/protection: Surgical  Other Topics Concern  . Not on file  Social History Narrative  . Not on file   Social Determinants of Health   Financial Resource Strain: Not on file  Food Insecurity: Not on file  Transportation Needs: Not on file  Physical Activity: Not on file  Stress: Not on file  Social Connections: Not on file  Intimate Partner Violence: Not on file   Family History  Problem Relation Age of Onset  . Hypertension Mother   . Hypertension Father   . Hypertension Sister   . Arthritis Maternal Grandmother   . Hypertension Maternal Grandmother   . Hypertension Maternal Grandfather   . Hypertension Paternal Grandmother   . Alcohol abuse Paternal Grandfather   . Hypertension Paternal Grandfather       Review of Systems     Objective:   Physical Exam Vitals reviewed.  Constitutional:      General: He is not in acute distress.    Appearance: He is well-developed. He is not diaphoretic.  HENT:     Head: Normocephalic and atraumatic.     Right Ear: External ear normal.     Left Ear: External ear normal.     Nose: Nose normal.     Mouth/Throat:     Pharynx: No oropharyngeal exudate.  Eyes:     General: No scleral icterus.       Right eye: No discharge.        Left eye: No discharge.     Conjunctiva/sclera: Conjunctivae normal.     Pupils: Pupils are equal, round, and reactive to light.  Neck:     Thyroid: No thyromegaly.     Vascular: No JVD.     Trachea: No tracheal deviation.  Cardiovascular:     Rate and Rhythm: Normal rate and regular rhythm.     Heart sounds: Normal heart sounds. No murmur heard. No friction rub. No gallop.   Pulmonary:     Effort: Pulmonary effort is normal. No respiratory distress.     Breath sounds: Normal breath sounds. No stridor. No wheezing or rales.  Chest:     Chest wall: No tenderness.  Abdominal:     General: Bowel sounds are  normal. There is no distension.     Palpations: Abdomen is soft. There is no mass.     Tenderness: There is no abdominal tenderness. There is no guarding or rebound.  Musculoskeletal:        General: No tenderness. Normal range of motion.     Cervical back: Normal range of motion and neck supple.  Lymphadenopathy:     Cervical: No cervical adenopathy.  Skin:    General: Skin is warm.     Coloration: Skin is not pale.     Findings: No erythema or rash.  Neurological:     Mental Status: He is alert and oriented to person, place, and time.     Cranial Nerves: No cranial nerve deficit.     Motor: No abnormal muscle tone.     Coordination: Coordination normal.     Deep Tendon Reflexes: Reflexes are normal and symmetric.  Psychiatric:        Behavior: Behavior normal.        Thought Content: Thought content normal.        Judgment: Judgment normal.  Assessment & Plan:  Diabetes mellitus type 2 with complications ([place]) - Plan: Hemoglobin A1c, COMPLETE METABOLIC PANEL WITH GFR, Lipid panel, Microalbumin, urine  Benign essential HTN  Hyperlipidemia associated with type 2 diabetes mellitus ([place])  I asked the patient to start checking his blood pressure every day.  If consistently greater than 140/90 I would double his metoprolol.  I did give him a prescription for Viagra as requested.  Check CMP, lipid panel, A1c, and urine microalbumin.  Goal A1c is less than 6.5.  Goal albumin to creatinine ratio is less than 30.  Goal LDL cholesterol is less than 100.  Patient defers podiatry consultation at the present time

## 2020-05-24 LAB — COMPLETE METABOLIC PANEL WITH GFR
AG Ratio: 2.4 (calc) (ref 1.0–2.5)
ALT: 19 U/L (ref 9–46)
AST: 17 U/L (ref 10–35)
Albumin: 4.6 g/dL (ref 3.6–5.1)
Alkaline phosphatase (APISO): 79 U/L (ref 35–144)
BUN: 17 mg/dL (ref 7–25)
CO2: 26 mmol/L (ref 20–32)
Calcium: 9.6 mg/dL (ref 8.6–10.3)
Chloride: 105 mmol/L (ref 98–110)
Creat: 1.04 mg/dL (ref 0.70–1.25)
GFR, Est African American: 89 mL/min/{1.73_m2} (ref >=60)
GFR, Est Non African American: 77 mL/min/{1.73_m2} (ref >=60)
Globulin: 1.9 g/dL (calc) (ref 1.9–3.7)
Glucose, Bld: 130 mg/dL — ABNORMAL HIGH (ref 65–99)
Potassium: 4.2 mmol/L (ref 3.5–5.3)
Sodium: 141 mmol/L (ref 135–146)
Total Bilirubin: 0.5 mg/dL (ref 0.2–1.2)
Total Protein: 6.5 g/dL (ref 6.1–8.1)

## 2020-05-24 LAB — HEMOGLOBIN A1C
Hgb A1c MFr Bld: 6.2 % of total Hgb — ABNORMAL HIGH (ref <5.7)
Mean Plasma Glucose: 131 mg/dL
eAG (mmol/L): 7.3 mmol/L

## 2020-05-24 LAB — LIPID PANEL
Cholesterol: 179 mg/dL (ref <200)
HDL: 48 mg/dL (ref >=40)
LDL Cholesterol (Calc): 107 mg/dL (calc) — ABNORMAL HIGH
Non-HDL Cholesterol (Calc): 131 mg/dL (calc) — ABNORMAL HIGH (ref <130)
Total CHOL/HDL Ratio: 3.7 (calc) (ref <5.0)
Triglycerides: 128 mg/dL (ref <150)

## 2020-05-24 LAB — MICROALBUMIN, URINE: Microalb, Ur: 0.4 mg/dL

## 2020-05-27 ENCOUNTER — Telehealth: Payer: Self-pay | Admitting: *Deleted

## 2020-05-27 NOTE — Telephone Encounter (Signed)
Received request from pharmacy for PA on sildenafil.   PA submitted.   Dx: [email or web address]  [employer] is reviewing your PA request. You may close this dialog, return to your dashboard, and perform other tasks.  To check for an update later, open this request again from your dashboard. If [employer] has not replied within 24 hours for urgent requests or within 48 hours for standard requests, please contact [employer] at [phone].

## 2020-05-28 NOTE — Telephone Encounter (Signed)
Received PA determination.   PA approved 05/27/2020- 05/26/2021.

## 2020-06-18 ENCOUNTER — Telehealth: Payer: Self-pay | Admitting: *Deleted

## 2020-06-18 NOTE — Telephone Encounter (Signed)
Received call from patient.   Reports that he has completed Metformin and was advised that new medication would be started.   No medication changes noted in chart notes or lab results.   Please advise.

## 2020-06-19 NOTE — Telephone Encounter (Signed)
A1c is excellent in April at 6.2 because of the metformin.  I would continue metformin indefinitely unless he is having side effects.

## 2020-06-20 MED ORDER — METFORMIN HCL ER 500 MG PO TB24: 1000.0000 mg | ORAL_TABLET | Freq: Every day | ORAL | 3 refills | Status: DC

## 2020-06-20 NOTE — Telephone Encounter (Signed)
Call placed to patient and patient made aware.   States that he was supposed to change from IR to XR.   Prescription sent to pharmacy.

## 2020-06-20 NOTE — Addendum Note (Signed)
Addended by: Phillips Odor on: 06/20/2020 12:43 PM   Modules accepted: Orders

## 2020-06-20 NOTE — Telephone Encounter (Signed)
Late documentation for 06/19/2020: Call placed to patient. No answer. VM noted full.

## 2020-06-20 NOTE — Telephone Encounter (Signed)
Call placed to patient. LMTRC.  

## 2020-07-21 ENCOUNTER — Other Ambulatory Visit: Payer: Self-pay

## 2020-07-21 MED ORDER — METOPROLOL SUCCINATE ER 50 MG PO TB24: 50.0000 mg | ORAL_TABLET | Freq: Every day | ORAL | 3 refills | Status: DC

## 2020-09-15 ENCOUNTER — Other Ambulatory Visit: Payer: Self-pay | Admitting: Family Medicine

## 2021-01-09 MED ORDER — AMLODIPINE BESYLATE 10 MG PO TABS: 10.0000 mg | ORAL_TABLET | Freq: Every day | ORAL | 2 refills | Status: DC

## 2021-01-09 NOTE — Addendum Note (Signed)
Addended by: Grier Rocher on: 01/09/2021 12:27 PM   Modules accepted: Orders

## 2021-01-13 ENCOUNTER — Other Ambulatory Visit: Payer: Self-pay

## 2021-01-13 MED ORDER — AMLODIPINE BESYLATE 10 MG PO TABS: 10.0000 mg | ORAL_TABLET | Freq: Every day | ORAL | 0 refills | Status: DC

## 2021-04-25 ENCOUNTER — Other Ambulatory Visit: Payer: Self-pay | Admitting: Family Medicine

## 2021-05-28 ENCOUNTER — Other Ambulatory Visit: Payer: Self-pay | Admitting: Family Medicine

## 2021-05-28 NOTE — Telephone Encounter (Signed)
Pharmacy faxed a new prescription refill request for  colcrys. Please advise this is a new med request. ?

## 2021-07-06 ENCOUNTER — Other Ambulatory Visit: Payer: Self-pay | Admitting: Family Medicine

## 2021-08-09 ENCOUNTER — Other Ambulatory Visit: Payer: Self-pay | Admitting: Family Medicine

## 2021-08-10 NOTE — Telephone Encounter (Signed)
Requested medication (s) are due for refill today: yes   Requested medication (s) are on the active medication list: {yes  Last refill:  07/21/20  Future visit scheduled: no  Notes to clinic:  Unable to refill per protocol, appointment needed.      Requested Prescriptions  Pending Prescriptions Disp Refills   metoprolol succinate (TOPROL-XL) 50 MG 24 hr tablet [Pharmacy Med Name: METOPROLOL SUCC ER 50 MG TAB] 90 tablet 3    Sig: TAKE 1 TABLET BY MOUTH DAILY. TAKE WITH OR IMMEDIATELY FOLLOWING A MEAL.     Cardiovascular:  Beta Blockers Failed - 08/09/2021 11:17 AM      Failed - Last BP in normal range    BP Readings from Last 1 Encounters:  05/23/20 (!) 148/88         Failed - Valid encounter within last 6 months    Recent Outpatient Visits           1 year ago Diabetes mellitus type 2 with complications (HCC)   [facility] Pickard, Priscille Heidelberg, MD   1 year ago COVID   [facility] Medicine Donita Brooks, MD   2 years ago Diabetes mellitus type 2 with complications [facility])   [facility] Pickard, Priscille Heidelberg, MD   2 years ago General medical exam   [facility] Medicine Donita Brooks, MD   3 years ago Benign essential HTN   [facility] Family Medicine Pickard, Priscille Heidelberg, MD              Passed - Last Heart Rate in normal range    Pulse Readings from Last 1 Encounters:  05/23/20 82

## 2021-09-01 IMAGING — DX DG CHEST 1V PORT
1 series · 1 of 1 positions shown · non-contrast
Comparison: Chest CTA 3 days ago.

CLINICAL DATA: COVID.  Decreased oxygen saturation.

EXAM:
PORTABLE CHEST 1 VIEW

[chest ap]
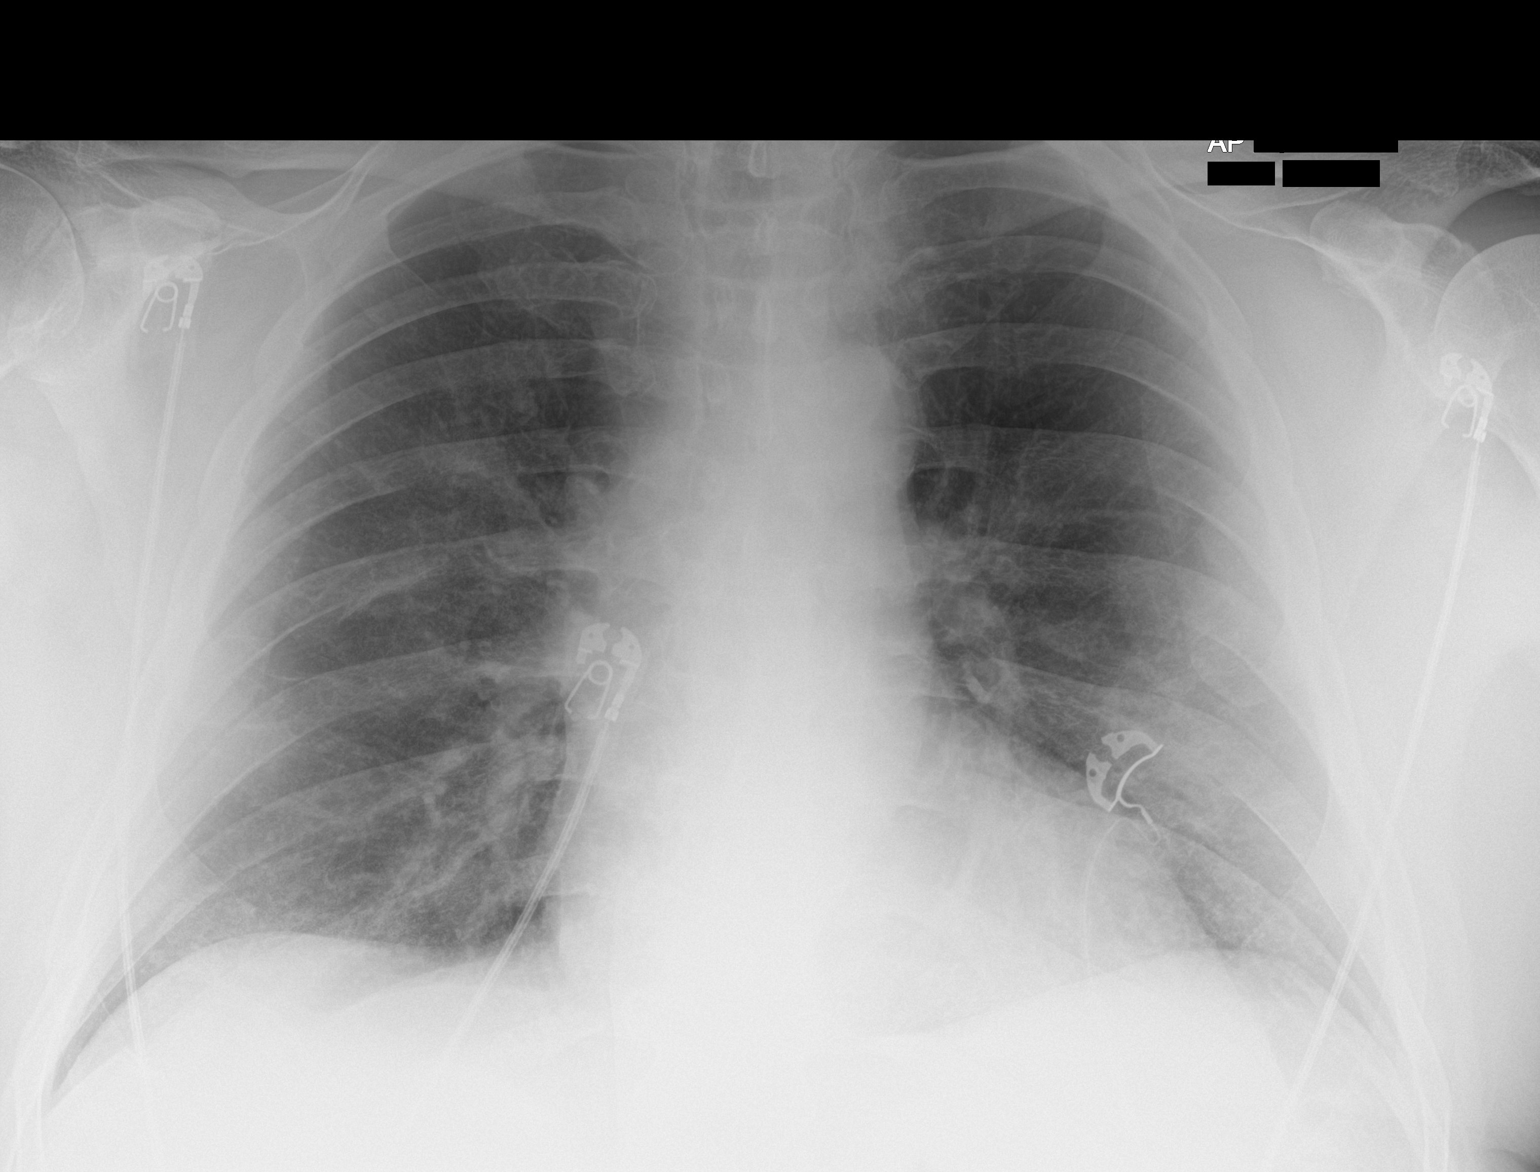

[1 of 1 positions shown; findings below may reference images not displayed]

FINDINGS: Upper normal heart size with normal mediastinal contours. Patchy
heterogeneous bilateral airspace opacities in a mid-lower lung zone
predominant distribution. There may be slight improvement from
recent CT. No evidence of pneumomediastinum. No pneumothorax. No
pleural fluid. Stable osseous structures.
IMPRESSION: Patchy heterogeneous bilateral airspace opacities in a mid-lower
lung zone predominant distribution consistent with COVID pneumonia.
There may be slight improvement from recent CT.

## 2021-09-12 ENCOUNTER — Other Ambulatory Visit: Payer: Self-pay | Admitting: Family Medicine

## 2021-09-14 NOTE — Telephone Encounter (Signed)
Called, left vm to call back to schedule OV for additional refills. Will refill medication for 30 days.  Requested Prescriptions  Pending Prescriptions Disp Refills  . metoprolol succinate (TOPROL-XL) 50 MG 24 hr tablet [Pharmacy Med Name: METOPROLOL SUCC ER 50 MG TAB] 30 tablet 0    Sig: TAKE 1 TABLET BY MOUTH DAILY. TAKE WITH OR IMMEDIATELY FOLLOWING A MEAL.     Cardiovascular:  Beta Blockers Failed - 09/12/2021 10:47 AM      Failed - Last BP in normal range    BP Readings from Last 1 Encounters:  05/23/20 (!) 148/88         Failed - Valid encounter within last 6 months    Recent Outpatient Visits          1 year ago Diabetes mellitus type 2 with complications (HCC)   [facility] Medicine Pickard, Priscille Heidelberg, MD   1 year ago COVID   [facility] Medicine Donita Brooks, MD   2 years ago Diabetes mellitus type 2 with complications [facility])   [facility] Medicine Pickard, Priscille Heidelberg, MD   2 years ago General medical exam   [facility] Donita Brooks, MD   3 years ago Benign essential HTN   [facility] Pickard, Priscille Heidelberg, MD             Passed - Last Heart Rate in normal range    Pulse Readings from Last 1 Encounters:  05/23/20 82

## 2021-10-08 ENCOUNTER — Other Ambulatory Visit: Payer: Self-pay | Admitting: Family Medicine

## 2021-10-08 NOTE — Telephone Encounter (Signed)
Please call pt to make a physcial exam w/pcp for med refills/eval

## 2021-10-12 NOTE — Telephone Encounter (Signed)
Left message to return call; need to schedule a physcial exam w/pcp for med refills/eval.

## 2021-10-23 ENCOUNTER — Ambulatory Visit: Payer: Self-pay | Admitting: Family Medicine

## 2021-10-26 ENCOUNTER — Ambulatory Visit: Payer: 59 | Admitting: Family Medicine

## 2021-10-26 VITALS — BP 130/80 | HR 74 | Temp 98.2°F | Ht 72.0 in | Wt 249.4 lb

## 2021-10-26 DIAGNOSIS — I10 Essential (primary) hypertension: Secondary | ICD-10-CM

## 2021-10-26 DIAGNOSIS — E1169 Type 2 diabetes mellitus with other specified complication: Secondary | ICD-10-CM

## 2021-10-26 DIAGNOSIS — Z125 Encounter for screening for malignant neoplasm of prostate: Secondary | ICD-10-CM | POA: Diagnosis not present

## 2021-10-26 DIAGNOSIS — E785 Hyperlipidemia, unspecified: Secondary | ICD-10-CM | POA: Diagnosis not present

## 2021-10-26 DIAGNOSIS — E118 Type 2 diabetes mellitus with unspecified complications: Secondary | ICD-10-CM

## 2021-10-26 MED ORDER — METOPROLOL SUCCINATE ER 50 MG PO TB24: 50.0000 mg | ORAL_TABLET | Freq: Every day | ORAL | 0 refills | Status: DC

## 2021-10-26 NOTE — Progress Notes (Signed)
Subjective:    Patient ID: Andrew Cross, male    DOB: January 04, 1956, 66 y.o.   MRN: [id number]  HPI Patient is a very pleasant 66 year old Caucasian gentleman here today for follow-up of his diabetes.  He denies any neuropathy in his feet.  He denies any numbness or tingling or burning.  He denies any chest pain.  He denies any angina.  He admits that he has not been taking his metformin.  He states that he saw his fasting blood sugar a week or so ago and over 200.  At that point he resumed his metformin.  Has been back on the metformin now for 1 week.  Fasting blood sugar this morning was 150.  He is due for a flu shot which she declines today.  Diabetic foot exam was normal.  Past Medical History:  Diagnosis Date   Diabetes mellitus type 2 with complications ([place])    H/O: gout    HLD (hyperlipidemia)    Hypertension    Past Surgical History:  Procedure Laterality Date   FRACTURE SURGERY  1970 / 1980   left forearm / right ankle   VASECTOMY  1993   Current Outpatient Medications on File Prior to Visit  Medication Sig Dispense Refill   amLODipine (NORVASC) 10 MG tablet Take 1 tablet (10 mg total) by mouth daily. NEEDS OFFICE VISIT 30 tablet 0   amLODipine (NORVASC) 5 MG tablet TAKE 1 TABLET BY MOUTH EVERY DAY 90 tablet 3   blood glucose meter kit and supplies Dispense based on patient and insurance preference. Use up to four times daily as directed. (FOR ICD-10 E10.9, E11.9). 1 each 0   famotidine (PEPCID) 20 MG tablet Take 1 tablet (20 mg total) by mouth 2 (two) times daily. 60 tablet 0   glucose blood test strip Use as instructed 100 each 12   metFORMIN (GLUCOPHAGE-XR) 500 MG 24 hr tablet TAKE 2 TABLETS BY MOUTH EVERY DAY WITH BREAKFAST 180 tablet 1   metoprolol succinate (TOPROL-XL) 50 MG 24 hr tablet TAKE 1 TABLET BY MOUTH DAILY. TAKE WITH OR IMMEDIATELY FOLLOWING A MEAL. 30 tablet 0   ramipril (ALTACE) 10 MG capsule TAKE 1 CAPSULE BY MOUTH EVERY DAY 90 capsule 0   sildenafil  (VIAGRA) 100 MG tablet Take 0.5-1 tablets (50-100 mg total) by mouth daily as needed for erectile dysfunction. 5 tablet 11   No current facility-administered medications on file prior to visit.    No Known Allergies Social History   Socioeconomic History   Marital status: Married    Spouse name: Not on file   Number of children: Not on file   Years of education: Not on file   Highest education level: Not on file  Occupational History   Not on file  Tobacco Use   Smoking status: Never   Smokeless tobacco: Never  Substance and Sexual Activity   Alcohol use: Not Currently    Comment: 2 mixed drinks a month   Drug use: No   Sexual activity: Not Currently    Birth control/protection: Surgical  Other Topics Concern   Not on file  Social History Narrative   Not on file   Social Determinants of Health   Financial Resource Strain: Not on file  Food Insecurity: Not on file  Transportation Needs: Not on file  Physical Activity: Not on file  Stress: Not on file  Social Connections: Not on file  Intimate Partner Violence: Not on file   Family History  Problem  Relation Age of Onset   Hypertension Mother    Hypertension Father    Hypertension Sister    Arthritis Maternal Grandmother    Hypertension Maternal Grandmother    Hypertension Maternal Grandfather    Hypertension Paternal Grandmother    Alcohol abuse Paternal Grandfather    Hypertension Paternal Grandfather       Review of Systems     Objective:   Physical Exam Vitals reviewed.  Constitutional:      General: He is not in acute distress.    Appearance: He is well-developed. He is not diaphoretic.  HENT:     Head: Normocephalic and atraumatic.     Right Ear: External ear normal.     Left Ear: External ear normal.     Nose: Nose normal.     Mouth/Throat:     Pharynx: No oropharyngeal exudate.  Eyes:     General: No scleral icterus.       Right eye: No discharge.        Left eye: No discharge.      Conjunctiva/sclera: Conjunctivae normal.     Pupils: Pupils are equal, round, and reactive to light.  Neck:     Thyroid: No thyromegaly.     Vascular: No JVD.     Trachea: No tracheal deviation.  Cardiovascular:     Rate and Rhythm: Normal rate and regular rhythm.     Heart sounds: Normal heart sounds. No murmur heard.    No friction rub. No gallop.  Pulmonary:     Effort: Pulmonary effort is normal. No respiratory distress.     Breath sounds: Normal breath sounds. No stridor. No wheezing or rales.  Chest:     Chest wall: No tenderness.  Abdominal:     General: Bowel sounds are normal. There is no distension.     Palpations: Abdomen is soft. There is no mass.     Tenderness: There is no abdominal tenderness. There is no guarding or rebound.  Musculoskeletal:        General: No tenderness. Normal range of motion.     Cervical back: Normal range of motion and neck supple.  Lymphadenopathy:     Cervical: No cervical adenopathy.  Skin:    General: Skin is warm.     Coloration: Skin is not pale.     Findings: No erythema or rash.  Neurological:     Mental Status: He is alert and oriented to person, place, and time.     Cranial Nerves: No cranial nerve deficit.     Motor: No abnormal muscle tone.     Coordination: Coordination normal.     Deep Tendon Reflexes: Reflexes are normal and symmetric.  Psychiatric:        Behavior: Behavior normal.        Thought Content: Thought content normal.        Judgment: Judgment normal.           Assessment & Plan:  Diabetes mellitus type 2 with complications (HCC) - Plan: Hemoglobin A1c, CBC with Differential/Platelet, Lipid panel, Microalbumin, urine, COMPLETE METABOLIC PANEL WITH GFR  Benign essential HTN - Plan: Hemoglobin A1c, CBC with Differential/Platelet, Lipid panel, Microalbumin, urine, COMPLETE METABOLIC PANEL WITH GFR  Hyperlipidemia associated with type 2 diabetes mellitus ([place]) - Plan: Hemoglobin A1c, CBC with  Differential/Platelet, Lipid panel, Microalbumin, urine, COMPLETE METABOLIC PANEL WITH GFR  Prostate cancer screening - Plan: PSA Fasting blood sugars sound out of control.  We will check his A1c but  I suspect that we will need to increase his metformin to 2000 mg a day in divided doses.  Blood pressure today is acceptable.  I will check CMP and a urine microalbumin.  Goal albumin to creatinine ratio is less than 30.Marland Kitchen  Check a fasting lipid panel.  Goal LDL cholesterol is less than 100.  Diabetic foot exam today is outstanding.  I will screen for prostate cancer with PSA.

## 2021-10-26 NOTE — Addendum Note (Signed)
Addended by: Randal Buba K on: 10/26/2021 04:51 PM   Modules accepted: Orders

## 2021-10-27 ENCOUNTER — Other Ambulatory Visit: Payer: Self-pay | Admitting: Family Medicine

## 2021-10-27 DIAGNOSIS — I10 Essential (primary) hypertension: Secondary | ICD-10-CM

## 2021-10-27 LAB — CBC WITH DIFFERENTIAL/PLATELET
Absolute Monocytes: 449 cells/uL (ref 200–950)
Basophils Absolute: 41 cells/uL (ref 0–200)
Basophils Relative: 0.6 %
Eosinophils Absolute: 117 cells/uL (ref 15–500)
Eosinophils Relative: 1.7 %
HCT: 45.5 % (ref 38.5–50.0)
Hemoglobin: 16.5 g/dL (ref 13.2–17.1)
Lymphs Abs: 2298 cells/uL (ref 850–3900)
MCH: 33.6 pg — ABNORMAL HIGH (ref 27.0–33.0)
MCHC: 36.3 g/dL — ABNORMAL HIGH (ref 32.0–36.0)
MCV: 92.7 fL (ref 80.0–100.0)
MPV: 10.9 fL (ref 7.5–12.5)
Monocytes Relative: 6.5 %
Neutro Abs: 3995 cells/uL (ref 1500–7800)
Neutrophils Relative %: 57.9 %
Platelets: 212 10*3/uL (ref 140–400)
RBC: 4.91 10*6/uL (ref 4.20–5.80)
RDW: 11.9 % (ref 11.0–15.0)
Total Lymphocyte: 33.3 %
WBC: 6.9 10*3/uL (ref 3.8–10.8)

## 2021-10-27 LAB — MICROALBUMIN, URINE: Microalb, Ur: <0.2 mg/dL

## 2021-10-27 LAB — COMPLETE METABOLIC PANEL WITH GFR
AG Ratio: 2.1 (calc) (ref 1.0–2.5)
ALT: 29 U/L (ref 9–46)
AST: 21 U/L (ref 10–35)
Albumin: 4.7 g/dL (ref 3.6–5.1)
Alkaline phosphatase (APISO): 90 U/L (ref 35–144)
BUN: 16 mg/dL (ref 7–25)
CO2: 26 mmol/L (ref 20–32)
Calcium: 9.6 mg/dL (ref 8.6–10.3)
Chloride: 103 mmol/L (ref 98–110)
Creat: 1.01 mg/dL (ref 0.70–1.35)
Globulin: 2.2 g/dL (calc) (ref 1.9–3.7)
Glucose, Bld: 154 mg/dL — ABNORMAL HIGH (ref 65–99)
Potassium: 4.4 mmol/L (ref 3.5–5.3)
Sodium: 141 mmol/L (ref 135–146)
Total Bilirubin: 0.6 mg/dL (ref 0.2–1.2)
Total Protein: 6.9 g/dL (ref 6.1–8.1)
eGFR: 84 mL/min/{1.73_m2} (ref >=60)

## 2021-10-27 LAB — LIPID PANEL
Cholesterol: 168 mg/dL (ref <200)
HDL: 45 mg/dL (ref >=40)
LDL Cholesterol (Calc): 102 mg/dL (calc) — ABNORMAL HIGH
Non-HDL Cholesterol (Calc): 123 mg/dL (calc) (ref <130)
Total CHOL/HDL Ratio: 3.7 (calc) (ref <5.0)
Triglycerides: 114 mg/dL (ref <150)

## 2021-10-27 LAB — HEMOGLOBIN A1C
Hgb A1c MFr Bld: 7.6 % of total Hgb — ABNORMAL HIGH (ref <5.7)
Mean Plasma Glucose: 171 mg/dL
eAG (mmol/L): 9.5 mmol/L

## 2021-10-27 LAB — PSA: PSA: 4.5 ng/mL — ABNORMAL HIGH (ref <=4.00)

## 2021-10-28 ENCOUNTER — Other Ambulatory Visit: Payer: Self-pay

## 2021-10-28 DIAGNOSIS — E118 Type 2 diabetes mellitus with unspecified complications: Secondary | ICD-10-CM

## 2021-10-28 DIAGNOSIS — R972 Elevated prostate specific antigen [PSA]: Secondary | ICD-10-CM

## 2021-10-28 MED ORDER — METFORMIN HCL ER 500 MG PO TB24: ORAL_TABLET | ORAL | 3 refills | Status: DC

## 2021-10-28 NOTE — Telephone Encounter (Signed)
Requested medication (s) are due for refill today:   Yes for all 3  Requested medication (s) are on the active medication list:   Yes for all 3  Future visit scheduled:   No  Notes indicate he needs an A1C in 3 months.    LOV 10/26/2021   Last ordered:  Returned because notes indicates a dose change in metformin, metoprolol for 30 day supply only,     Requested Prescriptions  Pending Prescriptions Disp Refills   ramipril (ALTACE) 10 MG capsule [Pharmacy Med Name: RAMIPRIL 10 MG CAPSULE] 30 capsule 2    Sig: TAKE 1 CAPSULE BY MOUTH EVERY DAY     Cardiovascular:  ACE Inhibitors Failed - 10/27/2021  6:59 PM      Failed - Valid encounter within last 6 months    Recent Outpatient Visits           1 year ago Diabetes mellitus type 2 with complications ([place])   [place] Pickard, Cammie Mcgee, MD   1 year ago [place] Susy Frizzle, MD   2 years ago Diabetes mellitus type 2 with complications ([place])   [place] Susy Frizzle, MD   2 years ago General medical exam   [place] Susy Frizzle, MD   3 years ago Benign essential HTN   [place] Pickard, Cammie Mcgee, MD              Passed - Cr in normal range and within 180 days    Creat  Date Value Ref Range Status  10/26/2021 1.01 0.70 - 1.35 mg/dL Final         Passed - K in normal range and within 180 days    Potassium  Date Value Ref Range Status  10/26/2021 4.4 3.5 - 5.3 mmol/L Final         Passed - Patient is not pregnant      Passed - Last BP in normal range    BP Readings from Last 1 Encounters:  10/26/21 130/80          metoprolol succinate (TOPROL-XL) 50 MG 24 hr tablet [Pharmacy Med Name: METOPROLOL SUCC ER 50 MG TAB] 30 tablet 0    Sig: TAKE 1 TABLET BY MOUTH DAILY. TAKE WITH OR IMMEDIATELY FOLLOWING A MEAL.     Cardiovascular:  Beta Blockers Failed - 10/27/2021  6:59 PM      Failed - Valid encounter  within last 6 months    Recent Outpatient Visits           1 year ago Diabetes mellitus type 2 with complications ([place])   [place] Pickard, Cammie Mcgee, MD   1 year ago [place] Susy Frizzle, MD   2 years ago Diabetes mellitus type 2 with complications [facility])   [place] Pickard, Cammie Mcgee, MD   2 years ago General medical exam   [place] Susy Frizzle, MD   3 years ago Benign essential HTN   [place] Pickard, Cammie Mcgee, MD              Passed - Last BP in normal range    BP Readings from Last 1 Encounters:  10/26/21 130/80         Passed - Last Heart Rate in normal range    Pulse Readings  from Last 1 Encounters:  10/26/21 74          metFORMIN (GLUCOPHAGE-XR) 500 MG 24 hr tablet [Pharmacy Med Name: METFORMIN HCL ER 500 MG TABLET] 180 tablet 1    Sig: TAKE 2 TABLETS BY MOUTH [place]     Endocrinology:  Diabetes - Biguanides Failed - 10/27/2021  6:59 PM      Failed - B12 Level in normal range and within 720 days    No results found for: "VITAMINB12"       Failed - Valid encounter within last 6 months    Recent Outpatient Visits           1 year ago Diabetes mellitus type 2 with complications ([place])   [place] Pickard, Cammie Mcgee, MD   1 year ago [place] Pickard, Cammie Mcgee, MD   2 years ago Diabetes mellitus type 2 with complications ([place])   [place] Pickard, Cammie Mcgee, MD   2 years ago General medical exam   [place] Susy Frizzle, MD   3 years ago Benign essential HTN   [place] Denn[place] Schaumann, Cammie Mcgee, MD              Failed - CBC within normal limits and completed in the last 12 months    WBC  Date Value Ref Range Status  10/26/2021 6.9 3.8 - 10.8 Thousand/uL Final   RBC  Date Value Ref Range Status  10/26/2021 4.91 4.20 -  5.80 Million/uL Final   Hemoglobin  Date Value Ref Range Status  10/26/2021 16.5 13.2 - 17.1 g/dL Final   HCT  Date Value Ref Range Status  10/26/2021 45.5 38.5 - 50.0 % Final   MCHC  Date Value Ref Range Status  10/26/2021 36.3 (H) 32.0 - 36.0 g/dL Final   [facility]  Date Value Ref Range Status  10/26/2021 33.6 (H) 27.0 - 33.0 pg Final   MCV  Date Value Ref Range Status  10/26/2021 92.7 80.0 - 100.0 fL Final   No results found for: "PLTCOUNTKUC", "LABPLAT", "POCPLA" RDW  Date Value Ref Range Status  10/26/2021 11.9 11.0 - 15.0 % Final         Passed - Cr in normal range and within 360 days    Creat  Date Value Ref Range Status  10/26/2021 1.01 0.70 - 1.35 mg/dL Final         Passed - HBA1C is between 0 and 7.9 and within 180 days    Hgb A1c MFr Bld  Date Value Ref Range Status  10/26/2021 7.6 (H) <5.7 % of total Hgb Final    Comment:    For someone without known diabetes, a hemoglobin A1c value of 6.5% or greater indicates that they may have  diabetes and this should be confirmed with a follow-up  test. . For someone with known diabetes, a value <7% indicates  that their diabetes is well controlled and a value  greater than or equal to 7% indicates suboptimal  control. A1c targets should be individualized based on  duration of diabetes, age, comorbid conditions, and  other considerations. . Currently, no consensus exists regarding use of hemoglobin A1c for diagnosis of diabetes for children. .          Passed - eGFR in normal range and within 360 days    GFR, Est African American  Date Value Ref Range Status  05/23/2020  89 > OR = 60 mL/min/1.21m Final   GFR, Est Non African American  Date Value Ref Range Status  05/23/2020 77 > OR = 60 mL/min/1.749mFinal   eGFR  Date Value Ref Range Status  10/26/2021 84 > OR = 60 mL/min/1.7363minal

## 2021-11-19 ENCOUNTER — Encounter: Payer: Self-pay | Admitting: Urology

## 2021-11-19 ENCOUNTER — Ambulatory Visit: Payer: 59 | Admitting: Urology

## 2021-11-19 VITALS — BP 185/97 | HR 78 | Ht 71.0 in | Wt 248.0 lb

## 2021-11-19 DIAGNOSIS — R972 Elevated prostate specific antigen [PSA]: Secondary | ICD-10-CM | POA: Diagnosis not present

## 2021-11-19 LAB — URINALYSIS, COMPLETE
Bilirubin, UA: NEGATIVE
Glucose, UA: NEGATIVE
Ketones, UA: NEGATIVE
Leukocytes,UA: NEGATIVE
Nitrite, UA: NEGATIVE
RBC, UA: NEGATIVE
Specific Gravity, UA: 1.02 (ref 1.005–1.030)
Urobilinogen, Ur: 0.2 mg/dL (ref 0.2–1.0)
pH, UA: 5 (ref 5.0–7.5)

## 2021-11-19 LAB — MICROSCOPIC EXAMINATION
Bacteria, UA: NONE SEEN
Epithelial Cells (non renal): NONE SEEN /hpf (ref 0–10)

## 2021-11-19 NOTE — Progress Notes (Signed)
11/19/2021 8:44 AM   Andrew Cross 1955-06-27 [id number]  Referring provider: Susy Frizzle, MD [address] [place],  [place] [address]  Chief Complaint  Patient presents with   Elevated PSA    HPI: Andrew Cross is a 66 y.o. male referred for evaluation of an elevated PSA.  PSA 10/26/2021 elevated 4.50 Previous PSA April 2021 was 1.3 and PSA levels and 2017 and 2019 were 1.0 and 1.5 respectively No bothersome LUTS Denies dysuria, gross hematuria No flank, abdominal, pelvic pain No family history prostate cancer   PMH: Past Medical History:  Diagnosis Date   Diabetes mellitus type 2 with complications ([place])    H/O: gout    HLD (hyperlipidemia)    Hypertension     Surgical History: Past Surgical History:  Procedure Laterality Date   FRACTURE SURGERY  1970 / 1980   left forearm / right ankle   VASECTOMY  1993    Home Medications:  Allergies as of 11/19/2021   No Known Allergies      Medication List        Accurate as of November 19, 2021  8:44 AM. If you have any questions, ask your nurse or doctor.          amLODipine 10 MG tablet Commonly known as: NORVASC Take 1 tablet (10 mg total) by mouth daily. NEEDS OFFICE VISIT   blood glucose meter kit and supplies Dispense based on patient and insurance preference. Use up to four times daily as directed. (FOR ICD-10 E10.9, E11.9).   glucose blood test strip Use as instructed   metFORMIN 500 MG 24 hr tablet Commonly known as: GLUCOPHAGE-XR Take 2 tablets by mouth twice per day with a meal.   metoprolol succinate 50 MG 24 hr tablet Commonly known as: TOPROL-XL TAKE 1 TABLET BY MOUTH DAILY. TAKE WITH OR IMMEDIATELY FOLLOWING A MEAL.   ramipril 10 MG capsule Commonly known as: ALTACE TAKE 1 CAPSULE BY MOUTH EVERY DAY   sildenafil 100 MG tablet Commonly known as: Viagra Take 0.5-1 tablets (50-100 mg total) by mouth daily as needed for erectile dysfunction.        Allergies:  No Known Allergies  Family History: Family History  Problem Relation Age of Onset   Hypertension Mother    Hypertension Father    Hypertension Sister    Arthritis Maternal Grandmother    Hypertension Maternal Grandmother    Hypertension Maternal Grandfather    Hypertension Paternal Grandmother    Alcohol abuse Paternal Grandfather    Hypertension Paternal Grandfather     Social History:  reports that he has never smoked. He has never used smokeless tobacco. He reports that he does not currently use alcohol. He reports that he does not use drugs.   Physical Exam: BP (!) 185/97   Pulse 78   Ht _0  (1.803 m)   Wt 248 lb (112.5 kg)   BMI 34.59 kg/m   Constitutional:  Alert and oriented, No acute distress. HEENT: [place] AT Respiratory: Normal respiratory effort, no increased work of breathing. GI: Abdomen is soft, nontender, nondistended, no abdominal masses GU: Prostate 40 g, smooth without nodules Psychiatric: Normal mood and affect.  Laboratory Data:  Lab Results  Component Value Date   PSA 4.50 (H) 10/26/2021   PSA 1.3 05/22/2019   PSA 1.5 11/10/2017   Urinalysis Dipstick/microscopy negative  Assessment & Plan:    1.  Elevated PSA Benign DRE Although PSA is a prostate cancer screening  test he was informed that cancer is not the most common cause of an elevated PSA. Other potential causes including BPH and inflammation were discussed. He was informed that the only way to adequately diagnose prostate cancer would be a transrectal ultrasound and biopsy of the prostate. The procedure was discussed including potential risks of bleeding and infection/sepsis. He was also informed that a negative biopsy does not conclusively rule out the possibility that prostate cancer may be present and that continued monitoring is required. The use of newer adjunctive blood tests including 4kScore were discussed. The use of multiparametric prostate MRI to evaluate for abnormality suspicious  for high-grade prostate cancer and aid in targeted biopsy was reviewed. Continued periodic surveillance was also discussed but not recommended. Urinalysis today was clear and PSA was repeated.  If persistently elevated recommend prostate MRI   Abbie Sons, MD  [place] [address] [place], [place] [address] [phone]

## 2021-11-20 LAB — PSA: Prostate Specific Ag, Serum: 1.8 ng/mL (ref 0.0–4.0)

## 2021-11-23 ENCOUNTER — Encounter: Payer: Self-pay | Admitting: *Deleted

## 2021-12-18 ENCOUNTER — Other Ambulatory Visit: Payer: Self-pay

## 2021-12-18 DIAGNOSIS — I10 Essential (primary) hypertension: Secondary | ICD-10-CM

## 2021-12-18 MED ORDER — METOPROLOL SUCCINATE ER 50 MG PO TB24
50.0000 mg | ORAL_TABLET | Freq: Every day | ORAL | 0 refills | Status: DC
Start: 2021-12-18 — End: 2022-02-10

## 2022-02-10 ENCOUNTER — Other Ambulatory Visit: Payer: Self-pay | Admitting: Family Medicine

## 2022-02-10 ENCOUNTER — Telehealth: Payer: Self-pay | Admitting: Family Medicine

## 2022-02-10 DIAGNOSIS — I10 Essential (primary) hypertension: Secondary | ICD-10-CM

## 2022-02-10 NOTE — Telephone Encounter (Signed)
Last OV 10/26/21, will refill.  Requested Prescriptions  Pending Prescriptions Disp Refills   amLODipine (NORVASC) 10 MG tablet [Pharmacy Med Name: AMLODIPINE BESYLATE 10 MG TAB] 90 tablet 2    Sig: TAKE 1 TABLET BY MOUTH EVERY DAY     Cardiovascular: Calcium Channel Blockers 2 Failed - 02/10/2022  9:57 AM      Failed - Last BP in normal range    BP Readings from Last 1 Encounters:  11/19/21 (!) 185/97         Failed - Valid encounter within last 6 months    Recent Outpatient Visits           1 year ago Diabetes mellitus type 2 with complications ([place])   [place] Pickard, Cammie Mcgee, MD   1 year ago [place] Susy Frizzle, MD   2 years ago Diabetes mellitus type 2 with complications [facility])   [place] Pickard, Cammie Mcgee, MD   2 years ago General medical exam   [place] Susy Frizzle, MD   4 years ago Benign essential HTN   [place] Pickard, Cammie Mcgee, MD              Passed - Last Heart Rate in normal range    Pulse Readings from Last 1 Encounters:  11/19/21 78          metoprolol succinate (TOPROL-XL) 50 MG 24 hr tablet [Pharmacy Med Name: METOPROLOL SUCC ER 50 MG TAB] 30 tablet 0    Sig: TAKE 1 TABLET BY MOUTH DAILY. TAKE WITH OR IMMEDIATELY FOLLOWING A MEAL.     Cardiovascular:  Beta Blockers Failed - 02/10/2022  9:57 AM      Failed - Last BP in normal range    BP Readings from Last 1 Encounters:  11/19/21 (!) 185/97         Failed - Valid encounter within last 6 months    Recent Outpatient Visits           1 year ago Diabetes mellitus type 2 with complications ([place])   [place] Pickard, Cammie Mcgee, MD   1 year ago [place] Susy Frizzle, MD   2 years ago Diabetes mellitus type 2 with complications [facility])   [place] Pickard, Cammie Mcgee, MD   2 years ago General medical exam   [place] Susy Frizzle, MD   4 years ago Benign essential HTN   [place], Cammie Mcgee, MD              Passed - Last Heart Rate in normal range    Pulse Readings from Last 1 Encounters:  11/19/21 78

## 2022-02-10 NOTE — Telephone Encounter (Signed)
Prescription Request  02/10/2022  Is this a "Controlled Substance" medicine? No  LOV: 10/26/2021  What is the name of the medication or equipment?   ramipril (ALTACE) 10 MG capsule   Have you contacted your pharmacy to request a refill? Yes   Which pharmacy would you like this sent to?  [id number] [place] [address] [place] [address] Phone: [phone] Fax: [phone]    Patient notified that their request is being sent to the clinical staff for review and that they should receive a response within 2 business days.   Please advise pharmacist.

## 2022-02-11 ENCOUNTER — Other Ambulatory Visit: Payer: Self-pay

## 2022-02-11 DIAGNOSIS — I10 Essential (primary) hypertension: Secondary | ICD-10-CM

## 2022-02-11 MED ORDER — RAMIPRIL 10 MG PO CAPS: ORAL_CAPSULE | ORAL | 3 refills | Status: DC

## 2022-02-22 ENCOUNTER — Other Ambulatory Visit: Payer: Self-pay | Admitting: Family Medicine

## 2022-04-18 ENCOUNTER — Other Ambulatory Visit: Payer: Self-pay | Admitting: Family Medicine

## 2022-04-18 DIAGNOSIS — E118 Type 2 diabetes mellitus with unspecified complications: Secondary | ICD-10-CM

## 2022-05-23 ENCOUNTER — Other Ambulatory Visit: Payer: Self-pay | Admitting: Family Medicine

## 2022-05-23 DIAGNOSIS — I10 Essential (primary) hypertension: Secondary | ICD-10-CM

## 2022-06-29 ENCOUNTER — Other Ambulatory Visit: Payer: Self-pay

## 2022-06-29 ENCOUNTER — Other Ambulatory Visit: Payer: Self-pay | Admitting: Family Medicine

## 2022-06-29 ENCOUNTER — Telehealth: Payer: Self-pay | Admitting: Family Medicine

## 2022-06-29 DIAGNOSIS — I10 Essential (primary) hypertension: Secondary | ICD-10-CM

## 2022-06-29 MED ORDER — RAMIPRIL 10 MG PO CAPS
ORAL_CAPSULE | ORAL | 1 refills | Status: DC
Start: 2022-06-29 — End: 2022-06-29

## 2022-06-29 MED ORDER — RAMIPRIL 10 MG PO CAPS
ORAL_CAPSULE | ORAL | 1 refills | Status: DC
Start: 2022-06-29 — End: 2023-04-11

## 2022-06-29 NOTE — Telephone Encounter (Signed)
Prescription Request  06/29/2022  LOV: 10/26/2021  What is the name of the medication or equipment? ramipril (ALTACE) 10 MG capsule   Have you contacted your pharmacy to request a refill? Yes   Which pharmacy would you like this sent to?  [id number] [place] [address] [place] [address] Phone: [phone] Fax: [phone]    Patient notified that their request is being sent to the clinical staff for review and that they should receive a response within 2 business days.   Please advise at [facility] [phone]

## 2022-07-13 DIAGNOSIS — L03011 Cellulitis of right finger: Secondary | ICD-10-CM | POA: Diagnosis not present

## 2022-09-06 ENCOUNTER — Other Ambulatory Visit: Payer: Self-pay | Admitting: Family Medicine

## 2022-09-06 ENCOUNTER — Other Ambulatory Visit: Payer: Self-pay

## 2022-09-06 DIAGNOSIS — I10 Essential (primary) hypertension: Secondary | ICD-10-CM

## 2022-09-06 DIAGNOSIS — I152 Hypertension secondary to endocrine disorders: Secondary | ICD-10-CM

## 2022-09-06 MED ORDER — METOPROLOL SUCCINATE ER 50 MG PO TB24
50.0000 mg | ORAL_TABLET | Freq: Every day | ORAL | 1 refills | Status: DC
Start: 2022-09-06 — End: 2022-10-05

## 2022-09-06 MED ORDER — AMLODIPINE BESYLATE 10 MG PO TABS
10.0000 mg | ORAL_TABLET | Freq: Every day | ORAL | 1 refills | Status: DC
Start: 2022-09-06 — End: 2022-10-05

## 2022-09-06 NOTE — Telephone Encounter (Signed)
Prescription Request  09/06/2022  LOV: 10/26/2021  What is the name of the medication or equipment?   amLODipine (NORVASC) 10 MG tablet   metoprolol succinate (TOPROL-XL) 50 MG 24 hr tablet [[id number]]   Have you contacted your pharmacy to request a refill? Yes   Which pharmacy would you like this sent to?  [id number] [place] [address] [place] [address] Phone: [phone] Fax: [phone]    Patient notified that their request is being sent to the clinical staff for review and that they should receive a response within 2 business days.   Please advise pharmacist.

## 2022-09-07 NOTE — Telephone Encounter (Signed)
Request was refilled 09/06/22 for 30 days and 1 refill. OV needed for additional refills.  Requested Prescriptions  Pending Prescriptions Disp Refills   amLODipine (NORVASC) 10 MG tablet 30 tablet 2    Sig: Take 1 tablet (10 mg total) by mouth daily.     Cardiovascular: Calcium Channel Blockers 2 Failed - 09/06/2022 12:29 PM      Failed - Last BP in normal range    BP Readings from Last 1 Encounters:  11/19/21 (!) 185/97         Failed - Valid encounter within last 6 months    Recent Outpatient Visits           2 years ago Diabetes mellitus type 2 with complications (HCC)   [facility] Pickard, Priscille Heidelberg, MD   2 years ago COVID   [facility] Tanya Nones, Priscille Heidelberg, MD   3 years ago Diabetes mellitus type 2 with complications (HCC)   [facility] Pickard, Priscille Heidelberg, MD   3 years ago General medical exam   [facility] Donita Brooks, MD   4 years ago Benign essential HTN   [facility] Tanya Nones, Priscille Heidelberg, MD              Passed - Last Heart Rate in normal range    Pulse Readings from Last 1 Encounters:  11/19/21 78          metoprolol succinate (TOPROL-XL) 50 MG 24 hr tablet 30 tablet 2    Sig: Take with or immediately following a meal.     Cardiovascular:  Beta Blockers Failed - 09/06/2022 12:29 PM      Failed - Last BP in normal range    BP Readings from Last 1 Encounters:  11/19/21 (!) 185/97         Failed - Valid encounter within last 6 months    Recent Outpatient Visits           2 years ago Diabetes mellitus type 2 with complications (HCC)   [facility] Pickard, Priscille Heidelberg, MD   2 years ago COVID   [facility] Donita Brooks, MD   3 years ago Diabetes mellitus type 2 with complications [facility]   [facility] Pickard, Priscille Heidelberg, MD   3 years ago General medical exam   [facility] Donita Brooks, MD   4 years ago  Benign essential HTN   [facility] Pickard, Priscille Heidelberg, MD              Passed - Last Heart Rate in normal range    Pulse Readings from Last 1 Encounters:  11/19/21 78

## 2022-10-02 ENCOUNTER — Other Ambulatory Visit: Payer: Self-pay | Admitting: Family Medicine

## 2022-10-02 DIAGNOSIS — I152 Hypertension secondary to endocrine disorders: Secondary | ICD-10-CM

## 2022-10-02 DIAGNOSIS — I10 Essential (primary) hypertension: Secondary | ICD-10-CM

## 2022-10-02 DIAGNOSIS — E1159 Type 2 diabetes mellitus with other circulatory complications: Secondary | ICD-10-CM

## 2022-10-05 NOTE — Telephone Encounter (Signed)
Requested Prescriptions  Pending Prescriptions Disp Refills   amLODipine (NORVASC) 10 MG tablet [Pharmacy Med Name: AMLODIPINE BESYLATE 10 MG TAB] 90 tablet 0    Sig: TAKE 1 TABLET BY MOUTH EVERY DAY     Cardiovascular: Calcium Channel Blockers 2 Failed - 10/02/2022 11:33 AM      Failed - Last BP in normal range    BP Readings from Last 1 Encounters:  11/19/21 (!) 185/97         Failed - Valid encounter within last 6 months    Recent Outpatient Visits           2 years ago Diabetes mellitus type 2 with complications (HCC)   [facility] Pickard, Priscille Heidelberg, MD   2 years ago COVID   [facility] Medicine Donita Brooks, MD   3 years ago Diabetes mellitus type 2 with complications [facility])   [facility] Pickard, Priscille Heidelberg, MD   3 years ago General medical exam   [facility] Donita Brooks, MD   4 years ago Benign essential HTN   [facility] Pickard, Priscille Heidelberg, MD              Passed - Last Heart Rate in normal range    Pulse Readings from Last 1 Encounters:  11/19/21 78          metoprolol succinate (TOPROL-XL) 50 MG 24 hr tablet [Pharmacy Med Name: METOPROLOL SUCC ER 50 MG TAB] 90 tablet 0    Sig: TAKE 1 TABLET BY MOUTH DAILY. TAKE WITH OR IMMEDIATELY FOLLOWING A MEAL.     Cardiovascular:  Beta Blockers Failed - 10/02/2022 11:33 AM      Failed - Last BP in normal range    BP Readings from Last 1 Encounters:  11/19/21 (!) 185/97         Failed - Valid encounter within last 6 months    Recent Outpatient Visits           2 years ago Diabetes mellitus type 2 with complications (HCC)   [facility] Pickard, Priscille Heidelberg, MD   2 years ago COVID   [facility] Medicine Donita Brooks, MD   3 years ago Diabetes mellitus type 2 with complications [facility])   [facility] Medicine Pickard, Priscille Heidelberg, MD   3 years ago General medical exam   [facility] Medicine  Donita Brooks, MD   4 years ago Benign essential HTN   [facility] Pickard, Priscille Heidelberg, MD              Passed - Last Heart Rate in normal range    Pulse Readings from Last 1 Encounters:  11/19/21 78

## 2022-12-07 ENCOUNTER — Encounter: Payer: Self-pay | Admitting: Family Medicine

## 2022-12-07 ENCOUNTER — Ambulatory Visit (INDEPENDENT_AMBULATORY_CARE_PROVIDER_SITE_OTHER): Payer: Self-pay | Admitting: Family Medicine

## 2022-12-07 VITALS — BP 160/90 | HR 74 | Temp 98.6°F | Ht 71.0 in | Wt 249.6 lb

## 2022-12-07 DIAGNOSIS — Z0000 Encounter for general adult medical examination without abnormal findings: Secondary | ICD-10-CM

## 2022-12-07 DIAGNOSIS — E1169 Type 2 diabetes mellitus with other specified complication: Secondary | ICD-10-CM

## 2022-12-07 DIAGNOSIS — I10 Essential (primary) hypertension: Secondary | ICD-10-CM

## 2022-12-07 DIAGNOSIS — E118 Type 2 diabetes mellitus with unspecified complications: Secondary | ICD-10-CM | POA: Diagnosis not present

## 2022-12-07 DIAGNOSIS — Z0001 Encounter for general adult medical examination with abnormal findings: Secondary | ICD-10-CM | POA: Diagnosis not present

## 2022-12-07 DIAGNOSIS — Z125 Encounter for screening for malignant neoplasm of prostate: Secondary | ICD-10-CM | POA: Diagnosis not present

## 2022-12-07 DIAGNOSIS — E785 Hyperlipidemia, unspecified: Secondary | ICD-10-CM

## 2022-12-07 DIAGNOSIS — Z7984 Long term (current) use of oral hypoglycemic drugs: Secondary | ICD-10-CM

## 2022-12-07 MED ORDER — HYDROCHLOROTHIAZIDE 25 MG PO TABS: 25.0000 mg | ORAL_TABLET | Freq: Every day | ORAL | 3 refills | Status: DC

## 2022-12-07 NOTE — Progress Notes (Signed)
Subjective:    Patient ID: Andrew Cross, male    DOB: 1955/12/05, 67 y.o.   MRN: [id number]  HPI  Patient is here today for complete physical exam.  He has not been checking his blood pressure however his blood pressure is extremely high today.  He denies any chest pain shortness of breath or dyspnea on exertion.  He is also not been checking his blood sugars.  His last hemoglobin A1c 1 year ago was 7.6.  At that time I recommended increasing metformin to 1000 mg twice daily.  He is only been taking metformin 1000 mg once daily.  He denies any polyuria polydipsia or blurry vision.  Colonoscopy was performed in 2018 and is up-to-date. Past Medical History:  Diagnosis Date   Diabetes mellitus type 2 with complications (HCC)    H/O: gout    HLD (hyperlipidemia)    Hypertension    Past Surgical History:  Procedure Laterality Date   FRACTURE SURGERY  1970 / 1980   left forearm / right ankle   VASECTOMY  1993   Current Outpatient Medications on File Prior to Visit  Medication Sig Dispense Refill   amLODipine (NORVASC) 10 MG tablet TAKE 1 TABLET BY MOUTH EVERY DAY 90 tablet 0   blood glucose meter kit and supplies Dispense based on patient and insurance preference. Use up to four times daily as directed. (FOR ICD-10 E10.9, E11.9). 1 each 0   glucose blood (FREESTYLE LITE) test strip USE AS INSTRUCTED 100 strip 12   metFORMIN (GLUCOPHAGE-XR) 500 MG 24 hr tablet TAKE 2 TABLETS BY MOUTH EVERY DAY WITH BREAKFAST 60 tablet 5   metoprolol succinate (TOPROL-XL) 50 MG 24 hr tablet TAKE 1 TABLET BY MOUTH DAILY. TAKE WITH OR IMMEDIATELY FOLLOWING A MEAL. 90 tablet 0   ramipril (ALTACE) 10 MG capsule TAKE 1 CAPSULE BY MOUTH EVERY DAY 90 capsule 1   sildenafil (VIAGRA) 100 MG tablet Take 0.5-1 tablets (50-100 mg total) by mouth daily as needed for erectile dysfunction. 5 tablet 11   [DISCONTINUED] metoprolol succinate (TOPROL-XL) 50 MG 24 hr tablet TAKE 1 TABLET BY MOUTH DAILY. TAKE WITH OR  IMMEDIATELY FOLLOWING A MEAL. 30 tablet 0   No current facility-administered medications on file prior to visit.    No Known Allergies Social History   Socioeconomic History   Marital status: Married    Spouse name: Not on file   Number of children: Not on file   Years of education: Not on file   Highest education level: Associate degree: occupational, Scientist, product/process development, or vocational program  Occupational History   Not on file  Tobacco Use   Smoking status: Never   Smokeless tobacco: Never  Substance and Sexual Activity   Alcohol use: Not Currently    Comment: 2 mixed drinks a month   Drug use: No   Sexual activity: Not Currently    Birth control/protection: Surgical  Other Topics Concern   Not on file  Social History Narrative   Not on file   Social Determinants of Health   Financial Resource Strain: Low Risk  (12/06/2022)   Overall Financial Resource Strain (CARDIA)    Difficulty of Paying Living Expenses: Not hard at all  Food Insecurity: No Food Insecurity (12/06/2022)   Hunger Vital Sign    Worried About Running Out of Food in the Last Year: Never true    Ran Out of Food in the Last Year: Never true  Transportation Needs: No Transportation Needs (12/06/2022)   PRAPARE -  Administrator, Civil Service (Medical): No    Lack of Transportation (Non-Medical): No  Physical Activity: Sufficiently Active (12/06/2022)   Exercise Vital Sign    Days of Exercise per Week: 4 days    Minutes of Exercise per Session: 60 min  Stress: No Stress Concern Present (12/06/2022)   [employer] of Occupational Health - Occupational Stress Questionnaire    Feeling of Stress : Only a little  Social Connections: Socially Integrated (12/06/2022)   Social Connection and Isolation Panel [NHANES]    Frequency of Communication with Friends and Family: More than three times a week    Frequency of Social Gatherings with Friends and Family: More than three times a week    Attends Religious  Services: More than 4 times per year    Active Member of [employer] or Organizations: Yes    Attends Engineer, structural: More than 4 times per year    Marital Status: Married  Catering manager Violence: Not on file   Family History  Problem Relation Age of Onset   Hypertension Mother    Hypertension Father    Hypertension Sister    Arthritis Maternal Grandmother    Hypertension Maternal Grandmother    Hypertension Maternal Grandfather    Hypertension Paternal Grandmother    Alcohol abuse Paternal Grandfather    Hypertension Paternal Grandfather       Review of Systems     Objective:   Physical Exam Vitals reviewed.  Constitutional:      General: He is not in acute distress.    Appearance: He is well-developed. He is not diaphoretic.  HENT:     Head: Normocephalic and atraumatic.     Right Ear: External ear normal.     Left Ear: External ear normal.     Nose: Nose normal.     Mouth/Throat:     Pharynx: No oropharyngeal exudate.  Eyes:     General: No scleral icterus.       Right eye: No discharge.        Left eye: No discharge.     Conjunctiva/sclera: Conjunctivae normal.     Pupils: Pupils are equal, round, and reactive to light.  Neck:     Thyroid: No thyromegaly.     Vascular: No JVD.     Trachea: No tracheal deviation.  Cardiovascular:     Rate and Rhythm: Normal rate and regular rhythm.     Heart sounds: Normal heart sounds. No murmur heard.    No friction rub. No gallop.  Pulmonary:     Effort: Pulmonary effort is normal. No respiratory distress.     Breath sounds: Normal breath sounds. No stridor. No wheezing or rales.  Chest:     Chest wall: No tenderness.  Abdominal:     General: Bowel sounds are normal. There is no distension.     Palpations: Abdomen is soft. There is no mass.     Tenderness: There is no abdominal tenderness. There is no guarding or rebound.  Musculoskeletal:        General: No tenderness. Normal range of motion.      Cervical back: Normal range of motion and neck supple.  Lymphadenopathy:     Cervical: No cervical adenopathy.  Skin:    General: Skin is warm.     Coloration: Skin is not pale.     Findings: No erythema or rash.  Neurological:     Mental Status: He is alert and oriented to person,  place, and time.     Cranial Nerves: No cranial nerve deficit.     Motor: No abnormal muscle tone.     Coordination: Coordination normal.     Deep Tendon Reflexes: Reflexes are normal and symmetric.  Psychiatric:        Behavior: Behavior normal.        Thought Content: Thought content normal.        Judgment: Judgment normal.           Assessment & Plan:  Diabetes mellitus type 2 with complications (HCC) - Plan: Hemoglobin A1c, CBC with Differential/Platelet, COMPLETE METABOLIC PANEL WITH GFR, Lipid panel, Microalbumin/Creatinine Ratio, Urine  Benign essential HTN  Prostate cancer screening - Plan: PSA  Hyperlipidemia associated with type 2 diabetes mellitus (HCC)  General medical exam Add hydrochlorothiazide 25 mg daily and recheck blood pressure in 3 weeks.  Check CBC CMP lipid panel A1c and urine protein to creatinine ratio.  Increase metformin to 2000 mg a day.  Likely will need to add an additional medication if sugars are not under good enough control.  Await the hemoglobin A1c to determine if additional medication is required.  Goal LDL cholesterol is less than 100.  Check PSA to screen for prostate cancer.  Colonoscopy is up-to-date.

## 2022-12-08 LAB — COMPLETE METABOLIC PANEL WITH GFR
AG Ratio: 2 (calc) (ref 1.0–2.5)
ALT: 24 U/L (ref 9–46)
AST: 19 U/L (ref 10–35)
Albumin: 4.5 g/dL (ref 3.6–5.1)
Alkaline phosphatase (APISO): 96 U/L (ref 35–144)
BUN: 21 mg/dL (ref 7–25)
CO2: 27 mmol/L (ref 20–32)
Calcium: 10 mg/dL (ref 8.6–10.3)
Chloride: 103 mmol/L (ref 98–110)
Creat: 1.12 mg/dL (ref 0.70–1.35)
Globulin: 2.3 g/dL (ref 1.9–3.7)
Glucose, Bld: 167 mg/dL — ABNORMAL HIGH (ref 65–99)
Potassium: 4.8 mmol/L (ref 3.5–5.3)
Sodium: 139 mmol/L (ref 135–146)
Total Bilirubin: 0.6 mg/dL (ref 0.2–1.2)
Total Protein: 6.8 g/dL (ref 6.1–8.1)
eGFR: 73 mL/min/{1.73_m2} (ref >=60)

## 2022-12-08 LAB — CBC WITH DIFFERENTIAL/PLATELET
Absolute Lymphocytes: 2539 {cells}/uL (ref 850–3900)
Absolute Monocytes: 662 {cells}/uL (ref 200–950)
Basophils Absolute: 74 {cells}/uL (ref 0–200)
Basophils Relative: 0.8 %
Eosinophils Absolute: 313 {cells}/uL (ref 15–500)
Eosinophils Relative: 3.4 %
HCT: 48.9 % (ref 38.5–50.0)
Hemoglobin: 16.9 g/dL (ref 13.2–17.1)
MCH: 33.2 pg — ABNORMAL HIGH (ref 27.0–33.0)
MCHC: 34.6 g/dL (ref 32.0–36.0)
MCV: 96.1 fL (ref 80.0–100.0)
MPV: 10.6 fL (ref 7.5–12.5)
Monocytes Relative: 7.2 %
Neutro Abs: 5612 {cells}/uL (ref 1500–7800)
Neutrophils Relative %: 61 %
Platelets: 258 10*3/uL (ref 140–400)
RBC: 5.09 10*6/uL (ref 4.20–5.80)
RDW: 12 % (ref 11.0–15.0)
Total Lymphocyte: 27.6 %
WBC: 9.2 10*3/uL (ref 3.8–10.8)

## 2022-12-08 LAB — HEMOGLOBIN A1C
Hgb A1c MFr Bld: 7.7 %{Hb} — ABNORMAL HIGH (ref <5.7)
Mean Plasma Glucose: 174 mg/dL
eAG (mmol/L): 9.7 mmol/L

## 2022-12-08 LAB — MICROALBUMIN / CREATININE URINE RATIO
Creatinine, Urine: 117 mg/dL (ref 20–320)
Microalb Creat Ratio: 3 mg/g{creat} (ref <30)
Microalb, Ur: 0.3 mg/dL

## 2022-12-08 LAB — LIPID PANEL
Cholesterol: 192 mg/dL (ref <200)
HDL: 55 mg/dL (ref >=40)
LDL Cholesterol (Calc): 116 mg/dL — ABNORMAL HIGH
Non-HDL Cholesterol (Calc): 137 mg/dL — ABNORMAL HIGH (ref <130)
Total CHOL/HDL Ratio: 3.5 (calc) (ref <5.0)
Triglycerides: 104 mg/dL (ref <150)

## 2022-12-08 LAB — PSA: PSA: 1.79 ng/mL (ref <=4.00)

## 2022-12-31 ENCOUNTER — Other Ambulatory Visit: Payer: Self-pay | Admitting: Family Medicine

## 2022-12-31 DIAGNOSIS — E1159 Type 2 diabetes mellitus with other circulatory complications: Secondary | ICD-10-CM

## 2022-12-31 DIAGNOSIS — I10 Essential (primary) hypertension: Secondary | ICD-10-CM

## 2023-01-03 ENCOUNTER — Encounter: Payer: Self-pay | Admitting: Family Medicine

## 2023-01-03 ENCOUNTER — Other Ambulatory Visit: Payer: Self-pay

## 2023-01-03 ENCOUNTER — Ambulatory Visit (INDEPENDENT_AMBULATORY_CARE_PROVIDER_SITE_OTHER): Payer: PPO | Admitting: Family Medicine

## 2023-01-03 VITALS — BP 146/82 | HR 76 | Temp 97.8°F | Ht 71.0 in | Wt 248.4 lb

## 2023-01-03 DIAGNOSIS — I10 Essential (primary) hypertension: Secondary | ICD-10-CM

## 2023-01-03 DIAGNOSIS — Z7984 Long term (current) use of oral hypoglycemic drugs: Secondary | ICD-10-CM

## 2023-01-03 DIAGNOSIS — E118 Type 2 diabetes mellitus with unspecified complications: Secondary | ICD-10-CM

## 2023-01-03 MED ORDER — FREESTYLE LITE TEST VI STRP: ORAL_STRIP | 12 refills | Status: DC

## 2023-01-03 MED ORDER — METFORMIN HCL ER 500 MG PO TB24: 1000.0000 mg | ORAL_TABLET | Freq: Two times a day (BID) | ORAL | 3 refills | Status: DC

## 2023-01-03 MED ORDER — EMPAGLIFLOZIN 25 MG PO TABS: 25.0000 mg | ORAL_TABLET | Freq: Every day | ORAL | 11 refills | Status: AC

## 2023-01-03 MED ORDER — FREESTYLE LITE TEST VI STRP: ORAL_STRIP | 12 refills | Status: AC

## 2023-01-03 NOTE — Addendum Note (Signed)
Addended by: Venia Carbon K on: 01/03/2023 09:47 AM   Modules accepted: Orders

## 2023-01-03 NOTE — Progress Notes (Signed)
Subjective:    Patient ID: Andrew Cross, male    DOB: 05/07/1955, 67 y.o.   MRN: [id number]  HPI  Patient's blood pressure was extremely high at his last visit.  As a result I added hydrochlorothiazide.  He is here today to recheck it.  His blood pressure remains elevated however it is much better.  He states that his blood pressure at home is typically 145-150 systolic.  He has not yet added Jardiance as recommended.  He is still taking metformin 1000 mg a day.  He has not increased to 2000 mg a day yet.  His A1c was 7.7 at his last visit. Past Medical History:  Diagnosis Date   Diabetes mellitus type 2 with complications (HCC)    H/O: gout    HLD (hyperlipidemia)    Hypertension    Past Surgical History:  Procedure Laterality Date   FRACTURE SURGERY  1970 / 1980   left forearm / right ankle   VASECTOMY  1993   Current Outpatient Medications on File Prior to Visit  Medication Sig Dispense Refill   amLODipine (NORVASC) 10 MG tablet TAKE 1 TABLET BY MOUTH EVERY DAY 90 tablet 0   blood glucose meter kit and supplies Dispense based on patient and insurance preference. Use up to four times daily as directed. (FOR ICD-10 E10.9, E11.9). 1 each 0   glucose blood (FREESTYLE LITE) test strip USE AS INSTRUCTED 100 strip 12   hydrochlorothiazide (HYDRODIURIL) 25 MG tablet Take 1 tablet (25 mg total) by mouth daily. 90 tablet 3   metoprolol succinate (TOPROL-XL) 50 MG 24 hr tablet TAKE 1 TABLET BY MOUTH DAILY. TAKE WITH OR IMMEDIATELY FOLLOWING A MEAL. 90 tablet 0   ramipril (ALTACE) 10 MG capsule TAKE 1 CAPSULE BY MOUTH EVERY DAY 90 capsule 1   sildenafil (VIAGRA) 100 MG tablet Take 0.5-1 tablets (50-100 mg total) by mouth daily as needed for erectile dysfunction. 5 tablet 11   [DISCONTINUED] metoprolol succinate (TOPROL-XL) 50 MG 24 hr tablet TAKE 1 TABLET BY MOUTH DAILY. TAKE WITH OR IMMEDIATELY FOLLOWING A MEAL. 30 tablet 0   No current facility-administered medications on file prior to  visit.    No Known Allergies Social History   Socioeconomic History   Marital status: Married    Spouse name: Not on file   Number of children: Not on file   Years of education: Not on file   Highest education level: Associate degree: occupational, Scientist, product/process development, or vocational program  Occupational History   Not on file  Tobacco Use   Smoking status: Never   Smokeless tobacco: Never  Substance and Sexual Activity   Alcohol use: Not Currently    Comment: 2 mixed drinks a month   Drug use: No   Sexual activity: Not Currently    Birth control/protection: Surgical  Other Topics Concern   Not on file  Social History Narrative   Not on file   Social Determinants of Health   Financial Resource Strain: Low Risk  (12/06/2022)   Overall Financial Resource Strain (CARDIA)    Difficulty of Paying Living Expenses: Not hard at all  Food Insecurity: No Food Insecurity (12/06/2022)   Hunger Vital Sign    Worried About Running Out of Food in the Last Year: Never true    Ran Out of Food in the Last Year: Never true  Transportation Needs: No Transportation Needs (12/06/2022)   PRAPARE - Transportation    Lack of Transportation (Medical): No    Lack  of Transportation (Non-Medical): No  Physical Activity: Sufficiently Active (12/06/2022)   Exercise Vital Sign    Days of Exercise per Week: 4 days    Minutes of Exercise per Session: 60 min  Stress: No Stress Concern Present (12/06/2022)   [employer] of Occupational Health - Occupational Stress Questionnaire    Feeling of Stress : Only a little  Social Connections: Socially Integrated (12/06/2022)   Social Connection and Isolation Panel [NHANES]    Frequency of Communication with Friends and Family: More than three times a week    Frequency of Social Gatherings with Friends and Family: More than three times a week    Attends Religious Services: More than 4 times per year    Active Member of [employer] or Organizations: Yes    Attends Museum/gallery exhibitions officer: More than 4 times per year    Marital Status: Married  Catering manager Violence: Not on file   Family History  Problem Relation Age of Onset   Hypertension Mother    Hypertension Father    Hypertension Sister    Arthritis Maternal Grandmother    Hypertension Maternal Grandmother    Hypertension Maternal Grandfather    Hypertension Paternal Grandmother    Alcohol abuse Paternal Grandfather    Hypertension Paternal Grandfather       Review of Systems     Objective:   Physical Exam Vitals reviewed.  Constitutional:      General: He is not in acute distress.    Appearance: He is well-developed. He is not diaphoretic.  HENT:     Head: Normocephalic and atraumatic.     Right Ear: External ear normal.     Left Ear: External ear normal.     Nose: Nose normal.     Mouth/Throat:     Pharynx: No oropharyngeal exudate.  Eyes:     General: No scleral icterus.       Right eye: No discharge.        Left eye: No discharge.     Conjunctiva/sclera: Conjunctivae normal.     Pupils: Pupils are equal, round, and reactive to light.  Neck:     Thyroid: No thyromegaly.     Vascular: No JVD.     Trachea: No tracheal deviation.  Cardiovascular:     Rate and Rhythm: Normal rate and regular rhythm.     Heart sounds: Normal heart sounds. No murmur heard.    No friction rub. No gallop.  Pulmonary:     Effort: Pulmonary effort is normal. No respiratory distress.     Breath sounds: Normal breath sounds. No stridor. No wheezing or rales.  Chest:     Chest wall: No tenderness.  Abdominal:     General: Bowel sounds are normal. There is no distension.     Palpations: Abdomen is soft. There is no mass.     Tenderness: There is no abdominal tenderness. There is no guarding or rebound.  Musculoskeletal:        General: No tenderness. Normal range of motion.     Cervical back: Normal range of motion and neck supple.  Lymphadenopathy:     Cervical: No cervical adenopathy.   Skin:    General: Skin is warm.     Coloration: Skin is not pale.     Findings: No erythema or rash.  Neurological:     Mental Status: He is alert and oriented to person, place, and time.     Cranial Nerves: No cranial nerve deficit.  Motor: No abnormal muscle tone.     Coordination: Coordination normal.     Deep Tendon Reflexes: Reflexes are normal and symmetric.  Psychiatric:        Behavior: Behavior normal.        Thought Content: Thought content normal.        Judgment: Judgment normal.           Assessment & Plan:  Benign essential HTN  Diabetes mellitus type 2 with complications (HCC) - Plan: metFORMIN (GLUCOPHAGE-XR) 500 MG 24 hr tablet Blood pressure is better but still not at goal.  I would like to see his blood pressure in the 130s over 80s.  Therefore I recommended adding Jardiance 25 mg a day to help lower his A1c, reduce his cardiovascular risk, and help lower his blood pressure.  I also recommended diet and exercise.  Increase metformin to 1000 mg twice a day.  Recheck fasting lab work in 3 months.  Patient will monitor his blood pressure at home and if not less than 140/90 in 1 month we can add additional medication such as doxazosin

## 2023-01-04 NOTE — Telephone Encounter (Signed)
Requested Prescriptions  Pending Prescriptions Disp Refills   metoprolol succinate (TOPROL-XL) 50 MG 24 hr tablet [Pharmacy Med Name: METOPROLOL SUCC ER 50 MG TAB] 90 tablet 0    Sig: TAKE 1 TABLET BY MOUTH EVERY DAY WITH OR IMMEDIATELY FOLLOWING A MEAL     Cardiovascular:  Beta Blockers Failed - 12/31/2022  9:34 AM      Failed - Last BP in normal range    BP Readings from Last 1 Encounters:  01/03/23 (!) 146/82         Failed - Valid encounter within last 6 months    Recent Outpatient Visits           2 years ago Diabetes mellitus type 2 with complications (HCC)   [facility] Pickard, Priscille Heidelberg, MD   2 years ago COVID   [facility] Medicine Donita Brooks, MD   3 years ago Diabetes mellitus type 2 with complications (HCC)   [facility] Medicine Pickard, Priscille Heidelberg, MD   3 years ago General medical exam   [facility] Medicine Donita Brooks, MD   5 years ago Benign essential HTN   [facility] Pickard, Priscille Heidelberg, MD       Future Appointments             In 11 months Pickard, Priscille Heidelberg, MD [facility]            Passed - Last Heart Rate in normal range    Pulse Readings from Last 1 Encounters:  01/03/23 76          amLODipine (NORVASC) 10 MG tablet [Pharmacy Med Name: AMLODIPINE BESYLATE 10 MG TAB] 90 tablet 0    Sig: TAKE 1 TABLET BY MOUTH EVERY DAY     Cardiovascular: Calcium Channel Blockers 2 Failed - 12/31/2022  9:34 AM      Failed - Last BP in normal range    BP Readings from Last 1 Encounters:  01/03/23 (!) 146/82         Failed - Valid encounter within last 6 months    Recent Outpatient Visits           2 years ago Diabetes mellitus type 2 with complications (HCC)   [facility] Pickard, Priscille Heidelberg, MD   2 years ago COVID   [facility] Medicine Donita Brooks, MD   3 years ago Diabetes mellitus type 2 with complications [facility])    [facility] Medicine Pickard, Priscille Heidelberg, MD   3 years ago General medical exam   [facility] Medicine Donita Brooks, MD   5 years ago Benign essential HTN   [facility] Pickard, Priscille Heidelberg, MD       Future Appointments             In 11 months Pickard, Priscille Heidelberg, MD [facility], [facility]            Passed - Last Heart Rate in normal range    Pulse Readings from Last 1 Encounters:  01/03/23 76

## 2023-01-10 ENCOUNTER — Telehealth: Payer: Self-pay

## 2023-01-10 NOTE — Telephone Encounter (Signed)
PA-Freestyle Lite test Strips  Outcome Denied on December 6 by [employer] 2017 Plan requirements for the approval of this request: Diagnosis of a medically-accepted indication, AND; The patient has tried and failed at least one preferred alternative, OR; The prescriber can submit a supporting statement to explain Drug FreeStyle Lite Test strips ePA cloud logo Form [employer] Medicare

## 2023-03-23 ENCOUNTER — Ambulatory Visit: Payer: PPO | Admitting: Podiatry

## 2023-03-23 ENCOUNTER — Ambulatory Visit (INDEPENDENT_AMBULATORY_CARE_PROVIDER_SITE_OTHER): Payer: PPO

## 2023-03-23 ENCOUNTER — Encounter: Payer: Self-pay | Admitting: Podiatry

## 2023-03-23 DIAGNOSIS — M778 Other enthesopathies, not elsewhere classified: Secondary | ICD-10-CM

## 2023-03-23 DIAGNOSIS — M19072 Primary osteoarthritis, left ankle and foot: Secondary | ICD-10-CM

## 2023-03-23 DIAGNOSIS — M19071 Primary osteoarthritis, right ankle and foot: Secondary | ICD-10-CM | POA: Diagnosis not present

## 2023-03-23 MED ORDER — MELOXICAM 15 MG PO TABS: 15.0000 mg | ORAL_TABLET | Freq: Every day | ORAL | 3 refills | Status: DC

## 2023-03-23 NOTE — Progress Notes (Unsigned)
Subjective:  Patient ID: Andrew Cross, male    DOB: Oct 07, 1955,  MRN: [id number] HPI Chief Complaint  Patient presents with   Foot Pain    "I been having problems with my feet hurting on the bottom." N - foot pain on the bottom L - plantar bilateral arch, rt > lt D - 6 mos O - suddenly, right gotten worse C - tender A - barefoot, certain shoes, excessive walking T - [employer]   Hammer Toe    "I have a Hammer Toe." N - hammer toe L - 2nd right D - few months O - gradually worse C - tender A - shoes T - none    68 y.o. male presents with the above complaint.   ROS: Denies fever chills nausea vomit muscle aches pains calf pain back pain chest pain shortness of breath  Past Medical History:  Diagnosis Date   Diabetes mellitus type 2 with complications (HCC)    H/O: gout    HLD (hyperlipidemia)    Hypertension    Past Surgical History:  Procedure Laterality Date   FRACTURE SURGERY  1970 / 1980   left forearm / right ankle   VASECTOMY  1993    Current Outpatient Medications:    amLODipine (NORVASC) 10 MG tablet, TAKE 1 TABLET BY MOUTH EVERY DAY, Disp: 90 tablet, Rfl: 0   blood glucose meter kit and supplies, Dispense based on patient and insurance preference. Use up to four times daily as directed. (FOR ICD-10 E10.9, E11.9)., Disp: 1 each, Rfl: 0   empagliflozin (JARDIANCE) 25 MG TABS tablet, Take 1 tablet (25 mg total) by mouth daily before breakfast., Disp: 30 tablet, Rfl: 11   glucose blood (FREESTYLE LITE) test strip, USE AS INSTRUCTED TO CHECK BLOOD SUGAR UP TO 4 TIMES PER DAY. DX CODE E11.9, Disp: 100 strip, Rfl: 12   hydrochlorothiazide (HYDRODIURIL) 25 MG tablet, Take 1 tablet (25 mg total) by mouth daily., Disp: 90 tablet, Rfl: 3   meloxicam (MOBIC) 15 MG tablet, Take 1 tablet (15 mg total) by mouth daily., Disp: 30 tablet, Rfl: 3   metFORMIN (GLUCOPHAGE-XR) 500 MG 24 hr tablet, Take 2 tablets (1,000 mg total) by mouth 2 (two) times daily with a meal.,  Disp: 360 tablet, Rfl: 3   metoprolol succinate (TOPROL-XL) 50 MG 24 hr tablet, TAKE 1 TABLET BY MOUTH EVERY DAY WITH OR IMMEDIATELY FOLLOWING A MEAL, Disp: 90 tablet, Rfl: 0   ramipril (ALTACE) 10 MG capsule, TAKE 1 CAPSULE BY MOUTH EVERY DAY, Disp: 90 capsule, Rfl: 1   sildenafil (VIAGRA) 100 MG tablet, Take 0.5-1 tablets (50-100 mg total) by mouth daily as needed for erectile dysfunction., Disp: 5 tablet, Rfl: 11  No Known Allergies Review of Systems Objective:  There were no vitals filed for this visit.  General: Well developed, nourished, in no acute distress, alert and oriented x3   Dermatological: Skin is warm, dry and supple bilateral. Nails x 10 are well maintained; remaining integument appears unremarkable at this time. There are no open sores, no preulcerative lesions, no rash or signs of infection present.  Vascular: Dorsalis Pedis artery and Posterior Tibial artery pedal pulses are 2/4 bilateral with immedate capillary fill time. Pedal hair growth present. No varicosities and no lower extremity edema present bilateral.   Neruologic: Grossly intact via light touch bilateral. Vibratory intact via tuning fork bilateral. Protective threshold with Semmes Wienstein monofilament intact to all pedal sites bilateral. Patellar and Achilles deep tendon reflexes 2+ bilateral.  No Babinski or clonus noted bilateral.   Musculoskeletal: No gross boney pedal deformities bilateral. No pain, crepitus, or limitation noted with foot and ankle range of motion bilateral. Muscular strength 5/5 in all groups tested bilateral.  Severe limited ankle joint range of motion of the right foot but limited subtalar joint range of motion as well.  He has a large none pulsatile nodularity around the ankle secondary to hypertrophic bone growth most likely.  Left foot and ankle demonstrate similar findings both feet do demonstrate what appears to have been gout a uric acid around the first metatarsal phalangeal joints  medially.  Also demonstrates pes planovalgus bilateral.  Gait: Unassisted, Nonantalgic.    Radiographs:  Radiographs taken today demonstrate severe osteoarthritis bilateral rear foot.  Severe osteoarthritis bilateral ankle right greater than left.  Hammertoe deformity second digit right foot.  Assessment & Plan:   Assessment: Severe osteoarthritis with pes planus bilateral.  Hammertoe deformity second digit right foot.  Plan: Discussed etiology pathology conservative surgical therapies at this point I feel the best thing to do is to treat with anti-inflammatories we will start meloxicam 15 mg 1 p.o. daily.     Rolando Hessling T. [place], [place]

## 2023-03-31 ENCOUNTER — Other Ambulatory Visit: Payer: Self-pay | Admitting: Family Medicine

## 2023-03-31 DIAGNOSIS — I10 Essential (primary) hypertension: Secondary | ICD-10-CM

## 2023-04-01 ENCOUNTER — Other Ambulatory Visit: Payer: Self-pay | Admitting: Family Medicine

## 2023-04-01 DIAGNOSIS — E1159 Type 2 diabetes mellitus with other circulatory complications: Secondary | ICD-10-CM

## 2023-04-11 ENCOUNTER — Telehealth: Payer: Self-pay | Admitting: Family Medicine

## 2023-04-11 ENCOUNTER — Other Ambulatory Visit: Payer: Self-pay

## 2023-04-11 DIAGNOSIS — I10 Essential (primary) hypertension: Secondary | ICD-10-CM

## 2023-04-11 MED ORDER — RAMIPRIL 10 MG PO CAPS: ORAL_CAPSULE | ORAL | 1 refills | Status: DC

## 2023-04-11 NOTE — Telephone Encounter (Signed)
 Prescription Request  04/11/2023  LOV: 01/03/2023  What is the name of the medication or equipment?   ramipril (ALTACE) 10 MG capsule   Have you contacted your pharmacy to request a refill? Yes   Which pharmacy would you like this sent to?  [id number] [address] [address] [place] [address] Phone: [phone] Fax: [phone]    Patient notified that their request is being sent to the clinical staff for review and that they should receive a response within 2 business days.   Please advise pharmacist.

## 2023-06-30 ENCOUNTER — Other Ambulatory Visit: Payer: Self-pay | Admitting: Family Medicine

## 2023-06-30 DIAGNOSIS — I10 Essential (primary) hypertension: Secondary | ICD-10-CM

## 2023-07-01 ENCOUNTER — Other Ambulatory Visit: Payer: Self-pay | Admitting: Family Medicine

## 2023-07-01 DIAGNOSIS — E1159 Type 2 diabetes mellitus with other circulatory complications: Secondary | ICD-10-CM

## 2023-07-02 NOTE — Telephone Encounter (Signed)
 Requested Prescriptions  Pending Prescriptions Disp Refills   amLODipine  (NORVASC ) 10 MG tablet [Pharmacy Med Name: AMLODIPINE  BESYLATE 10 MG TAB] 90 tablet 0    Sig: TAKE 1 TABLET BY MOUTH EVERY DAY     Cardiovascular: Calcium  Channel Blockers 2 Failed - 07/02/2023  2:50 PM      Failed - Last BP in normal range    BP Readings from Last 1 Encounters:  01/03/23 (!) 146/82         Failed - Valid encounter within last 6 months    Recent Outpatient Visits           6 months ago Benign essential HTN   [place] [facility] Austine Lefort, MD   6 months ago Diabetes mellitus type 2 with complications [facility])   [place] [facility] Austine Lefort, MD   1 year ago Diabetes mellitus type 2 with complications [facility])   [place] [facility] Pickard, Cisco Crest, MD       Future Appointments             In 5 months Pickard, Cisco Crest, MD [place] [facility], [facility]            Passed - Last Heart Rate in normal range    Pulse Readings from Last 1 Encounters:  01/03/23 76

## 2023-07-05 ENCOUNTER — Ambulatory Visit: Admitting: Family Medicine

## 2023-07-19 ENCOUNTER — Ambulatory Visit (INDEPENDENT_AMBULATORY_CARE_PROVIDER_SITE_OTHER): Admitting: Family Medicine

## 2023-07-19 VITALS — BP 170/90 | HR 91 | Temp 98.0°F | Resp 18 | Wt 238.0 lb

## 2023-07-19 DIAGNOSIS — E118 Type 2 diabetes mellitus with unspecified complications: Secondary | ICD-10-CM

## 2023-07-19 DIAGNOSIS — Z7984 Long term (current) use of oral hypoglycemic drugs: Secondary | ICD-10-CM

## 2023-07-19 DIAGNOSIS — G629 Polyneuropathy, unspecified: Secondary | ICD-10-CM

## 2023-07-19 MED ORDER — DOXAZOSIN MESYLATE 2 MG PO TABS: 2.0000 mg | ORAL_TABLET | Freq: Every day | ORAL | 11 refills | Status: AC

## 2023-07-19 NOTE — Progress Notes (Signed)
 Subjective:    Patient ID: Andrew Cross, male    DOB: May 03, 1955, 68 y.o.   MRN: [id number]  HPI In November, hgA1c was 7.7 and I recommended: Diabetes is poorly controlled.  Increase metformin  XR 2000 poqday and add jardiance  25 mg poqday and recheck in 3 months   Patient's blood pressure today is very high.  He states that his blood pressures at home is typically 145-156 follow-up however he is not checking.  He denies any chest pain or shortness of breath or dyspnea on exertion.  He is consistently taking his metformin  and Jardiance  and he states that his average sugar is around 150.  He does complain of polyuria especially at night.  He denies any dysuria.  He denies any chest pain or shortness of breath.  He is a diabetic and he is not currently on a statin.  He declines statin therapy.  We had a long discussion today about the risk of coronary artery disease.  I even discussed with him a coronary artery calcium  score as a way to determine if he has plaque developing in his heart that would benefit from taking a statin.  Patient reports neuropathy in both feet.  He reports numbness right foot greater than left foot and has diminished sensation to 10 g monofilament in the balls of his right foot.  He has excellent pulses and no ulceration. Past Surgical History:  Procedure Laterality Date   FRACTURE SURGERY  1970 / 1980   left forearm / right ankle   VASECTOMY  1993   Past Medical History:  Diagnosis Date   Diabetes mellitus type 2 with complications (HCC)    H/O: gout    HLD (hyperlipidemia)    Hypertension     Current Outpatient Medications on File Prior to Visit  Medication Sig Dispense Refill   amLODipine  (NORVASC ) 10 MG tablet TAKE 1 TABLET BY MOUTH EVERY DAY 90 tablet 1   blood glucose meter kit and supplies Dispense based on patient and insurance preference. Use up to four times daily as directed. (FOR ICD-10 E10.9, E11.9). 1 each 0   empagliflozin  (JARDIANCE ) 25 MG TABS  tablet Take 1 tablet (25 mg total) by mouth daily before breakfast. 30 tablet 11   glucose blood (FREESTYLE LITE) test strip USE AS INSTRUCTED TO CHECK BLOOD SUGAR UP TO 4 TIMES PER DAY. DX CODE E11.9 100 strip 12   hydrochlorothiazide  (HYDRODIURIL ) 25 MG tablet Take 1 tablet (25 mg total) by mouth daily. 90 tablet 3   meloxicam  (MOBIC ) 15 MG tablet Take 1 tablet (15 mg total) by mouth daily. 30 tablet 3   metFORMIN  (GLUCOPHAGE -XR) 500 MG 24 hr tablet Take 2 tablets (1,000 mg total) by mouth 2 (two) times daily with a meal. 360 tablet 3   metoprolol  succinate (TOPROL -XL) 50 MG 24 hr tablet TAKE 1 TABLET BY MOUTH EVERY DAY WITH OR IMMEDIATELY FOLLOWING A MEAL 90 tablet 0   ramipril  (ALTACE ) 10 MG capsule TAKE 1 CAPSULE BY MOUTH EVERY DAY 90 capsule 1   sildenafil  (VIAGRA ) 100 MG tablet Take 0.5-1 tablets (50-100 mg total) by mouth daily as needed for erectile dysfunction. 5 tablet 11   [DISCONTINUED] metoprolol  succinate (TOPROL -XL) 50 MG 24 hr tablet TAKE 1 TABLET BY MOUTH DAILY. TAKE WITH OR IMMEDIATELY FOLLOWING A MEAL. 30 tablet 0   No current facility-administered medications on file prior to visit.    No Known Allergies Social History   Socioeconomic History   Marital status: Married  Spouse name: Not on file   Number of children: Not on file   Years of education: Not on file   Highest education level: Associate degree: occupational, Scientist, product/process development, or vocational program  Occupational History   Not on file  Tobacco Use   Smoking status: Never   Smokeless tobacco: Never  Substance and Sexual Activity   Alcohol  use: Not Currently    Comment: 2 mixed drinks a month   Drug use: No   Sexual activity: Not Currently    Birth control/protection: Surgical  Other Topics Concern   Not on file  Social History Narrative   Not on file   Social Drivers of Health   Financial Resource Strain: Low Risk  (07/18/2023)   Overall Financial Resource Strain (CARDIA)    Difficulty of Paying Living  Expenses: Not hard at all  Food Insecurity: No Food Insecurity (07/18/2023)   Hunger Vital Sign    Worried About Running Out of Food in the Last Year: Never true    Ran Out of Food in the Last Year: Never true  Transportation Needs: No Transportation Needs (07/18/2023)   PRAPARE - Administrator, Civil Service (Medical): No    Lack of Transportation (Non-Medical): No  Physical Activity: Sufficiently Active (07/18/2023)   Exercise Vital Sign    Days of Exercise per Week: 5 days    Minutes of Exercise per Session: 40 min  Stress: No Stress Concern Present (07/18/2023)   [employer] of Occupational Health - Occupational Stress Questionnaire    Feeling of Stress: Not at all  Social Connections: Socially Integrated (07/18/2023)   Social Connection and Isolation Panel    Frequency of Communication with Friends and Family: More than three times a week    Frequency of Social Gatherings with Friends and Family: Three times a week    Attends Religious Services: More than 4 times per year    Active Member of Clubs or Organizations: Yes    Attends Engineer, structural: More than 4 times per year    Marital Status: Married  Catering manager Violence: Not on file   Family History  Problem Relation Age of Onset   Hypertension Mother    Hypertension Father    Hypertension Sister    Arthritis Maternal Grandmother    Hypertension Maternal Grandmother    Hypertension Maternal Grandfather    Hypertension Paternal Grandmother    Alcohol  abuse Paternal Grandfather    Hypertension Paternal Grandfather       Review of Systems     Objective:   Physical Exam Vitals reviewed.  Constitutional:      General: He is not in acute distress.    Appearance: He is well-developed. He is not diaphoretic.  HENT:     Head: Normocephalic and atraumatic.     Right Ear: External ear normal.     Left Ear: External ear normal.     Nose: Nose normal.     Mouth/Throat:     Pharynx: No  oropharyngeal exudate.   Eyes:     General: No scleral icterus.       Right eye: No discharge.        Left eye: No discharge.     Conjunctiva/sclera: Conjunctivae normal.     Pupils: Pupils are equal, round, and reactive to light.   Neck:     Thyroid: No thyromegaly.     Vascular: No JVD.     Trachea: No tracheal deviation.   Cardiovascular:  Rate and Rhythm: Normal rate and regular rhythm.     Heart sounds: Normal heart sounds. No murmur heard.    No friction rub. No gallop.  Pulmonary:     Effort: Pulmonary effort is normal. No respiratory distress.     Breath sounds: Normal breath sounds. No stridor. No wheezing or rales.  Chest:     Chest wall: No tenderness.  Abdominal:     General: Bowel sounds are normal. There is no distension.     Palpations: Abdomen is soft. There is no mass.     Tenderness: There is no abdominal tenderness. There is no guarding or rebound.   Musculoskeletal:        General: No tenderness. Normal range of motion.     Cervical back: Normal range of motion and neck supple.  Lymphadenopathy:     Cervical: No cervical adenopathy.   Skin:    General: Skin is warm.     Coloration: Skin is not pale.     Findings: No erythema or rash.   Neurological:     Mental Status: He is alert and oriented to person, place, and time.     Cranial Nerves: No cranial nerve deficit.     Motor: No abnormal muscle tone.     Coordination: Coordination normal.     Deep Tendon Reflexes: Reflexes are normal and symmetric.   Psychiatric:        Behavior: Behavior normal.        Thought Content: Thought content normal.        Judgment: Judgment normal.           Assessment & Plan:  Diabetes mellitus type 2 with complications (HCC) - Plan: Hemoglobin A1c, CBC with Differential/Platelet, Comprehensive metabolic panel with GFR, Lipid panel, Microalbumin/Creatinine Ratio, Urine  Neuropathy - Plan: Vitamin B12 Blood pressure is not at goal.  Add doxazosin 2 mg  daily and recheck blood pressure in 1 month.  Uptitrate to achieve systolic blood pressure less than 140.  See if adding doxazosin helps his polyuria.  Check hemoglobin A1c.  Consider switching Jardiance  to Mounjaro to achieve weight loss depending upon the results of his A1c.  Check urine microalbumin to creatinine ratio.  Check fasting lipid panel.  Due to neuropathy check B12.  Recommended a statin which she declines.  Recommended checking a coronary artery calcium  score to see if he has plaque developing in his arteries of his heart.  He declines this at the present time.

## 2023-07-20 LAB — LIPID PANEL
Cholesterol: 200 mg/dL — ABNORMAL HIGH (ref <200)
HDL: 51 mg/dL (ref >=40)
LDL Cholesterol (Calc): 123 mg/dL — ABNORMAL HIGH
Non-HDL Cholesterol (Calc): 149 mg/dL — ABNORMAL HIGH (ref <130)
Total CHOL/HDL Ratio: 3.9 (calc) (ref <5.0)
Triglycerides: 148 mg/dL (ref <150)

## 2023-07-20 LAB — CBC WITH DIFFERENTIAL/PLATELET
Absolute Lymphocytes: 1972 {cells}/uL (ref 850–3900)
Absolute Monocytes: 449 {cells}/uL (ref 200–950)
Basophils Absolute: 48 {cells}/uL (ref 0–200)
Basophils Relative: 0.7 %
Eosinophils Absolute: 231 {cells}/uL (ref 15–500)
Eosinophils Relative: 3.4 %
HCT: 53.3 % — ABNORMAL HIGH (ref 38.5–50.0)
Hemoglobin: 18.3 g/dL — ABNORMAL HIGH (ref 13.2–17.1)
MCH: 34 pg — ABNORMAL HIGH (ref 27.0–33.0)
MCHC: 34.3 g/dL (ref 32.0–36.0)
MCV: 99.1 fL (ref 80.0–100.0)
MPV: 10.3 fL (ref 7.5–12.5)
Monocytes Relative: 6.6 %
Neutro Abs: 4100 {cells}/uL (ref 1500–7800)
Neutrophils Relative %: 60.3 %
Platelets: 198 10*3/uL (ref 140–400)
RBC: 5.38 10*6/uL (ref 4.20–5.80)
RDW: 12.3 % (ref 11.0–15.0)
Total Lymphocyte: 29 %
WBC: 6.8 10*3/uL (ref 3.8–10.8)

## 2023-07-20 LAB — COMPREHENSIVE METABOLIC PANEL WITH GFR
AG Ratio: 2.4 (calc) (ref 1.0–2.5)
ALT: 21 U/L (ref 9–46)
AST: 16 U/L (ref 10–35)
Albumin: 4.7 g/dL (ref 3.6–5.1)
Alkaline phosphatase (APISO): 92 U/L (ref 35–144)
BUN: 22 mg/dL (ref 7–25)
CO2: 26 mmol/L (ref 20–32)
Calcium: 9.8 mg/dL (ref 8.6–10.3)
Chloride: 102 mmol/L (ref 98–110)
Creat: 1.19 mg/dL (ref 0.70–1.35)
Globulin: 2 g/dL (ref 1.9–3.7)
Glucose, Bld: 163 mg/dL — ABNORMAL HIGH (ref 65–99)
Potassium: 4.5 mmol/L (ref 3.5–5.3)
Sodium: 139 mmol/L (ref 135–146)
Total Bilirubin: 0.7 mg/dL (ref 0.2–1.2)
Total Protein: 6.7 g/dL (ref 6.1–8.1)
eGFR: 68 mL/min/{1.73_m2} (ref >=60)

## 2023-07-20 LAB — MICROALBUMIN / CREATININE URINE RATIO
Creatinine, Urine: 81 mg/dL (ref 20–320)
Microalb, Ur: <0.2 mg/dL

## 2023-07-20 LAB — HEMOGLOBIN A1C
Hgb A1c MFr Bld: 7.1 % — ABNORMAL HIGH (ref <5.7)
Mean Plasma Glucose: 157 mg/dL
eAG (mmol/L): 8.7 mmol/L

## 2023-07-20 LAB — VITAMIN B12: Vitamin B-12: 1081 pg/mL (ref 200–1100)

## 2023-07-21 ENCOUNTER — Ambulatory Visit: Payer: Self-pay | Admitting: Family Medicine

## 2023-07-22 ENCOUNTER — Other Ambulatory Visit (HOSPITAL_COMMUNITY): Payer: Self-pay

## 2023-07-22 ENCOUNTER — Telehealth: Payer: Self-pay

## 2023-07-22 ENCOUNTER — Other Ambulatory Visit: Payer: Self-pay

## 2023-07-22 MED ORDER — TIRZEPATIDE 2.5 MG/0.5ML ~~LOC~~ SOAJ: 2.5000 mg | SUBCUTANEOUS | 1 refills | Status: DC

## 2023-07-22 MED ORDER — ROSUVASTATIN CALCIUM 10 MG PO TABS
10.0000 mg | ORAL_TABLET | Freq: Every day | ORAL | 3 refills | Status: AC
Start: 2023-07-22 — End: ?

## 2023-07-22 NOTE — Telephone Encounter (Signed)
 Pharmacy Patient Advocate Encounter   Received notification from CoverMyMeds that prior authorization for [facility]) 2.5 MG/0.5ML Pen  is required/requested.   Insurance verification completed.   The patient is insured through [facility] ADVANTAGE/RX ADVANCE .   Per test claim: PA required; PA submitted to above mentioned insurance via CoverMyMeds Key/confirmation #/EOC (Key: [id number])   Status is pending

## 2023-07-25 ENCOUNTER — Other Ambulatory Visit (HOSPITAL_COMMUNITY): Payer: Self-pay

## 2023-07-25 NOTE — Telephone Encounter (Signed)
 Pharmacy Patient Advocate Encounter  Received notification from [facility] ADVANTAGE/RX ADVANCE that Prior Authorization for Mounjaro  2.5MG /0.5ML auto-injectors has been APPROVED from 6.20.25 to 6.20.26. Ran test claim, Copay is $RTS, RX LAST FILLED ON 6.23.25. This test claim was processed through [facility]- copay amounts may vary at other pharmacies due to pharmacy/plan contracts, or as the patient moves through the different stages of their insurance plan.   PA #/Case ID/Reference #: (Key: [id number])

## 2023-09-25 ENCOUNTER — Other Ambulatory Visit: Payer: Self-pay | Admitting: Family Medicine

## 2023-09-25 DIAGNOSIS — I10 Essential (primary) hypertension: Secondary | ICD-10-CM

## 2023-09-27 DIAGNOSIS — D485 Neoplasm of uncertain behavior of skin: Secondary | ICD-10-CM | POA: Diagnosis not present

## 2023-09-27 DIAGNOSIS — C44519 Basal cell carcinoma of skin of other part of trunk: Secondary | ICD-10-CM | POA: Diagnosis not present

## 2023-09-27 DIAGNOSIS — D225 Melanocytic nevi of trunk: Secondary | ICD-10-CM | POA: Diagnosis not present

## 2023-09-27 DIAGNOSIS — D2262 Melanocytic nevi of left upper limb, including shoulder: Secondary | ICD-10-CM | POA: Diagnosis not present

## 2023-09-27 DIAGNOSIS — L570 Actinic keratosis: Secondary | ICD-10-CM | POA: Diagnosis not present

## 2023-09-27 DIAGNOSIS — D2261 Melanocytic nevi of right upper limb, including shoulder: Secondary | ICD-10-CM | POA: Diagnosis not present

## 2023-09-27 DIAGNOSIS — D0439 Carcinoma in situ of skin of other parts of face: Secondary | ICD-10-CM | POA: Diagnosis not present

## 2023-09-27 DIAGNOSIS — D0471 Carcinoma in situ of skin of right lower limb, including hip: Secondary | ICD-10-CM | POA: Diagnosis not present

## 2023-09-27 DIAGNOSIS — L821 Other seborrheic keratosis: Secondary | ICD-10-CM | POA: Diagnosis not present

## 2023-09-27 NOTE — Telephone Encounter (Signed)
 Requested Prescriptions  Pending Prescriptions Disp Refills   metoprolol  succinate (TOPROL -XL) 50 MG 24 hr tablet [Pharmacy Med Name: METOPROLOL  SUCC ER 50 MG TAB] 90 tablet 0    Sig: TAKE 1 TABLET BY MOUTH EVERY DAY WITH OR IMMEDIATELY FOLLOWING A MEAL     Cardiovascular:  Beta Blockers Failed - 09/27/2023 10:15 AM      Failed - Last BP in normal range    BP Readings from Last 1 Encounters:  07/19/23 (!) 170/90         Passed - Last Heart Rate in normal range    Pulse Readings from Last 1 Encounters:  07/19/23 91         Passed - Valid encounter within last 6 months    Recent Outpatient Visits           2 months ago Diabetes mellitus type 2 with complications [facility])   [place] [facility] Duanne Butler DASEN, MD   8 months ago Benign essential HTN   [place] [facility] Duanne Butler DASEN, MD   9 months ago Diabetes mellitus type 2 with complications [facility])   [place] [facility] Duanne Butler DASEN, MD   1 year ago Diabetes mellitus type 2 with complications [facility])   [place] [facility] Medicine Pickard, Butler DASEN, MD       Future Appointments             In 2 months Pickard, Butler DASEN, MD [facility], [facility]

## 2023-09-28 ENCOUNTER — Telehealth: Payer: Self-pay

## 2023-09-28 ENCOUNTER — Other Ambulatory Visit: Payer: Self-pay

## 2023-09-28 MED ORDER — TIRZEPATIDE 2.5 MG/0.5ML ~~LOC~~ SOAJ: 2.5000 mg | SUBCUTANEOUS | 1 refills | Status: DC

## 2023-09-28 NOTE — Telephone Encounter (Signed)
 Prescription Request  09/28/2023  LOV: 07/19/23 What is the name of the medication or equipment? tirzepatide  (MOUNJARO ) 2.5 MG/0.5ML Pen [[id number]]   Have you contacted your pharmacy to request a refill? Yes   Which pharmacy would you like this sent to?  [id number] [place] [address] [place] [address] Phone: [phone] Fax: [phone]    Patient notified that their request is being sent to the clinical staff for review and that they should receive a response within 2 business days.   Please advise at [facility] [phone]

## 2023-09-28 NOTE — Telephone Encounter (Signed)
 Med sent in.

## 2023-10-14 ENCOUNTER — Other Ambulatory Visit: Payer: Self-pay | Admitting: Family Medicine

## 2023-10-14 DIAGNOSIS — I10 Essential (primary) hypertension: Secondary | ICD-10-CM

## 2023-10-14 NOTE — Telephone Encounter (Signed)
 Requested Prescriptions  Pending Prescriptions Disp Refills   ramipril  (ALTACE ) 10 MG capsule [Pharmacy Med Name: RAMIPRIL  10 MG CAPSULE] 90 capsule 0    Sig: TAKE 1 CAPSULE BY MOUTH EVERY DAY     Cardiovascular:  ACE Inhibitors Failed - 10/14/2023  2:27 PM      Failed - Last BP in normal range    BP Readings from Last 1 Encounters:  07/19/23 (!) 170/90         Passed - Cr in normal range and within 180 days    Creat  Date Value Ref Range Status  07/19/2023 1.19 0.70 - 1.35 mg/dL Final   Creatinine, Urine  Date Value Ref Range Status  07/19/2023 81 20 - 320 mg/dL Final         Passed - K in normal range and within 180 days    Potassium  Date Value Ref Range Status  07/19/2023 4.5 3.5 - 5.3 mmol/L Final         Passed - Patient is not pregnant      Passed - Valid encounter within last 6 months    Recent Outpatient Visits           2 months ago Diabetes mellitus type 2 with complications [facility])   [place] [facility] Duanne Butler DASEN, MD   9 months ago Benign essential HTN   [place] [facility] Duanne Butler DASEN, MD   10 months ago Diabetes mellitus type 2 with complications [facility])   [place] [facility] Duanne Butler DASEN, MD   1 year ago Diabetes mellitus type 2 with complications [facility])   [place] [facility] Medicine Pickard, Butler DASEN, MD       Future Appointments             In 1 month Pickard, Butler DASEN, MD  [facility], [facility]

## 2023-11-21 DIAGNOSIS — D0422 Carcinoma in situ of skin of left ear and external auricular canal: Secondary | ICD-10-CM | POA: Diagnosis not present

## 2023-11-21 DIAGNOSIS — D0439 Carcinoma in situ of skin of other parts of face: Secondary | ICD-10-CM | POA: Diagnosis not present

## 2023-11-28 DIAGNOSIS — C44519 Basal cell carcinoma of skin of other part of trunk: Secondary | ICD-10-CM | POA: Diagnosis not present

## 2023-12-08 ENCOUNTER — Other Ambulatory Visit: Payer: Self-pay | Admitting: Family Medicine

## 2023-12-09 ENCOUNTER — Ambulatory Visit: Payer: 59 | Admitting: Family Medicine

## 2023-12-09 ENCOUNTER — Encounter: Payer: Self-pay | Admitting: Family Medicine

## 2023-12-09 VITALS — BP 140/82 | HR 86 | Temp 97.9°F | Ht 71.0 in | Wt 233.8 lb

## 2023-12-09 DIAGNOSIS — Z125 Encounter for screening for malignant neoplasm of prostate: Secondary | ICD-10-CM | POA: Diagnosis not present

## 2023-12-09 DIAGNOSIS — Z7984 Long term (current) use of oral hypoglycemic drugs: Secondary | ICD-10-CM | POA: Diagnosis not present

## 2023-12-09 DIAGNOSIS — Z0001 Encounter for general adult medical examination with abnormal findings: Secondary | ICD-10-CM | POA: Diagnosis not present

## 2023-12-09 DIAGNOSIS — E118 Type 2 diabetes mellitus with unspecified complications: Secondary | ICD-10-CM

## 2023-12-09 DIAGNOSIS — Z0000 Encounter for general adult medical examination without abnormal findings: Secondary | ICD-10-CM

## 2023-12-09 NOTE — Progress Notes (Signed)
 Subjective:    Patient ID: Andrew Cross, male    DOB: Oct 02, 1955, 68 y.o.   MRN: [id number]  HPI Patient is a very pleasant 68 year old Caucasian gentleman here today for complete physical exam.  He has no concerns.  His blood pressure is borderline at 140/82.  He had a colonoscopy in 2018.  He is due again in 2028.  He is due today for a PSA to screen for prostate cancer.  He is also due for fasting lab work to monitor his cholesterol as well as his hemoglobin A1c.  He has a history of elevated cholesterol and has declined treatment thus far.  We discussed a coronary artery calcium  score.  I believe the patient is at risk for cardiovascular disease because of his diabetes and his hyperlipidemia.  This would help to determine if he needs to be more aggressive which I believe he does.  The patient is hesitant to get this test and will think about it.  He is due for pneumonia vaccine, shingles vaccine, flu shot, but the patient declines any vaccinations today.  Otherwise he is doing well with no concerns  Past Medical History:  Diagnosis Date   Diabetes mellitus type 2 with complications (HCC)    H/O: gout    HLD (hyperlipidemia)    Hypertension    Past Surgical History:  Procedure Laterality Date   FRACTURE SURGERY  1970 / 1980   left forearm / right ankle   VASECTOMY  1993   Current Outpatient Medications on File Prior to Visit  Medication Sig Dispense Refill   amLODipine  (NORVASC ) 10 MG tablet TAKE 1 TABLET BY MOUTH EVERY DAY 90 tablet 1   blood glucose meter kit and supplies Dispense based on patient and insurance preference. Use up to four times daily as directed. (FOR ICD-10 E10.9, E11.9). 1 each 0   empagliflozin  (JARDIANCE ) 25 MG TABS tablet Take 1 tablet (25 mg total) by mouth daily before breakfast. 30 tablet 11   glucose blood (FREESTYLE LITE) test strip USE AS INSTRUCTED TO CHECK BLOOD SUGAR UP TO 4 TIMES PER DAY. DX CODE E11.9 100 strip 12   hydrochlorothiazide   (HYDRODIURIL ) 25 MG tablet Take 1 tablet (25 mg total) by mouth daily. 90 tablet 3   metFORMIN  (GLUCOPHAGE -XR) 500 MG 24 hr tablet Take 2 tablets (1,000 mg total) by mouth 2 (two) times daily with a meal. 360 tablet 3   metoprolol  succinate (TOPROL -XL) 50 MG 24 hr tablet TAKE 1 TABLET BY MOUTH EVERY DAY WITH OR IMMEDIATELY FOLLOWING A MEAL 90 tablet 0   ramipril  (ALTACE ) 10 MG capsule TAKE 1 CAPSULE BY MOUTH EVERY DAY 90 capsule 0   rosuvastatin  (CRESTOR ) 10 MG tablet Take 1 tablet (10 mg total) by mouth daily. 90 tablet 3   sildenafil  (VIAGRA ) 100 MG tablet Take 0.5-1 tablets (50-100 mg total) by mouth daily as needed for erectile dysfunction. 5 tablet 11   tirzepatide  (MOUNJARO ) 2.5 MG/0.5ML Pen Inject 2.5 mg into the skin once a week. 2 mL 1   doxazosin  (CARDURA ) 2 MG tablet Take 1 tablet (2 mg total) by mouth daily. (Patient not taking: Reported on 12/09/2023) 30 tablet 11   [DISCONTINUED] metoprolol  succinate (TOPROL -XL) 50 MG 24 hr tablet TAKE 1 TABLET BY MOUTH DAILY. TAKE WITH OR IMMEDIATELY FOLLOWING A MEAL. 30 tablet 0   No current facility-administered medications on file prior to visit.    No Known Allergies Social History   Socioeconomic History   Marital status: Married  Spouse name: Not on file   Number of children: Not on file   Years of education: Not on file   Highest education level: Associate degree: occupational, scientist, product/process development, or vocational program  Occupational History   Not on file  Tobacco Use   Smoking status: Never   Smokeless tobacco: Never  Substance and Sexual Activity   Alcohol  use: Not Currently    Comment: 2 mixed drinks a month   Drug use: No   Sexual activity: Not Currently    Birth control/protection: Surgical  Other Topics Concern   Not on file  Social History Narrative   Not on file   Social Drivers of Health   Financial Resource Strain: Low Risk  (07/18/2023)   Overall Financial Resource Strain (CARDIA)    Difficulty of Paying Living  Expenses: Not hard at all  Food Insecurity: No Food Insecurity (07/18/2023)   Hunger Vital Sign    Worried About Running Out of Food in the Last Year: Never true    Ran Out of Food in the Last Year: Never true  Transportation Needs: No Transportation Needs (07/18/2023)   PRAPARE - Administrator, Civil Service (Medical): No    Lack of Transportation (Non-Medical): No  Physical Activity: Sufficiently Active (07/18/2023)   Exercise Vital Sign    Days of Exercise per Week: 5 days    Minutes of Exercise per Session: 40 min  Stress: No Stress Concern Present (07/18/2023)   [employer] of Occupational Health - Occupational Stress Questionnaire    Feeling of Stress: Not at all  Social Connections: Socially Integrated (07/18/2023)   Social Connection and Isolation Panel    Frequency of Communication with Friends and Family: More than three times a week    Frequency of Social Gatherings with Friends and Family: Three times a week    Attends Religious Services: More than 4 times per year    Active Member of Clubs or Organizations: Yes    Attends Engineer, Structural: More than 4 times per year    Marital Status: Married  Catering Manager Violence: Not on file   Family History  Problem Relation Age of Onset   Hypertension Mother    Hypertension Father    Hypertension Sister    Arthritis Maternal Grandmother    Hypertension Maternal Grandmother    Hypertension Maternal Grandfather    Hypertension Paternal Grandmother    Alcohol  abuse Paternal Grandfather    Hypertension Paternal Grandfather       Review of Systems     Objective:   Physical Exam Vitals reviewed.  Constitutional:      General: He is not in acute distress.    Appearance: He is well-developed. He is not diaphoretic.  HENT:     Head: Normocephalic and atraumatic.     Right Ear: External ear normal.     Left Ear: External ear normal.     Nose: Nose normal.     Mouth/Throat:     Pharynx: No  oropharyngeal exudate.  Eyes:     General: No scleral icterus.       Right eye: No discharge.        Left eye: No discharge.     Conjunctiva/sclera: Conjunctivae normal.     Pupils: Pupils are equal, round, and reactive to light.  Neck:     Thyroid: No thyromegaly.     Vascular: No JVD.     Trachea: No tracheal deviation.  Cardiovascular:     Rate  and Rhythm: Normal rate and regular rhythm.     Heart sounds: Normal heart sounds. No murmur heard.    No friction rub. No gallop.  Pulmonary:     Effort: Pulmonary effort is normal. No respiratory distress.     Breath sounds: Normal breath sounds. No stridor. No wheezing or rales.  Chest:     Chest wall: No tenderness.  Abdominal:     General: Bowel sounds are normal. There is no distension.     Palpations: Abdomen is soft. There is no mass.     Tenderness: There is no abdominal tenderness. There is no guarding or rebound.  Musculoskeletal:        General: No tenderness. Normal range of motion.     Cervical back: Normal range of motion and neck supple.  Lymphadenopathy:     Cervical: No cervical adenopathy.  Skin:    General: Skin is warm.     Coloration: Skin is not pale.     Findings: No erythema or rash.  Neurological:     Mental Status: He is alert and oriented to person, place, and time.     Cranial Nerves: No cranial nerve deficit.     Motor: No abnormal muscle tone.     Coordination: Coordination normal.     Deep Tendon Reflexes: Reflexes are normal and symmetric.  Psychiatric:        Behavior: Behavior normal.        Thought Content: Thought content normal.        Judgment: Judgment normal.           Assessment & Plan:  General medical exam - Plan: CBC with Differential/Platelet, Comprehensive metabolic panel with GFR, Lipid panel, Hemoglobin A1c, Microalbumin / creatinine urine ratio, PSA  Prostate cancer screening - Plan: PSA  Diabetes mellitus type 2 with complications (HCC) - Plan: CBC with  Differential/Platelet, Comprehensive metabolic panel with GFR, Lipid panel, Hemoglobin A1c, Microalbumin / creatinine urine ratio, PSA  I would like to see his LDL cholesterol less than 899.  He is currently on rosuvastatin  10 mg a day.  If he decides to get a coronary artery calcium  score, if his score is elevated I would want him to be on max dose statin and try to get his LDL cholesterol less than 70.  He will think about the coronary artery calcium  score and let me know.  He declines a pneumonia vaccine, shingles vaccine, and a flu shot.  His blood pressure is acceptable today.  I will screen for prostate cancer with a PSA.  I will check hemoglobin A1c along with a urine microalbumin to creatinine ratio.  Ideally I like to see his A1c less than 6.5.

## 2023-12-09 NOTE — Telephone Encounter (Signed)
 Requested medication (s) are due for refill today: yes  Requested medication (s) are on the active medication list: yes  Last refill:  09/28/23  Future visit scheduled: no  Notes to clinic:   Medication not assigned to a protocol, review manually.     Requested Prescriptions  Pending Prescriptions Disp Refills   MOUNJARO  2.5 MG/0.5ML Pen [Pharmacy Med Name: MOUNJARO  2.5 MG/0.5 ML PEN]  1    Sig: INJECT 2.5 MG SUBCUTANEOUSLY WEEKLY     Off-Protocol Failed - 12/09/2023  4:16 PM      Failed - Medication not assigned to a protocol, review manually.      Passed - Valid encounter within last 12 months    Recent Outpatient Visits           Today General medical exam   [facility] Duanne Butler DASEN, MD   4 months ago Diabetes mellitus type 2 with complications [facility])   [place] [facility] Duanne Butler DASEN, MD   11 months ago Benign essential HTN   [place] [facility] Duanne, Butler DASEN, MD   1 year ago Diabetes mellitus type 2 with complications [facility])   [place] [facility] Duanne Butler DASEN, MD   2 years ago Diabetes mellitus type 2 with complications [facility])   [place] [facility] [place], Butler DASEN, MD

## 2023-12-10 LAB — COMPREHENSIVE METABOLIC PANEL WITH GFR
AG Ratio: 2.1 (calc) (ref 1.0–2.5)
ALT: 17 U/L (ref 9–46)
AST: 16 U/L (ref 10–35)
Albumin: 4.5 g/dL (ref 3.6–5.1)
Alkaline phosphatase (APISO): 81 U/L (ref 35–144)
BUN: 20 mg/dL (ref 7–25)
CO2: 26 mmol/L (ref 20–32)
Calcium: 9.2 mg/dL (ref 8.6–10.3)
Chloride: 102 mmol/L (ref 98–110)
Creat: 1.09 mg/dL (ref 0.70–1.35)
Globulin: 2.1 g/dL (ref 1.9–3.7)
Glucose, Bld: 134 mg/dL — ABNORMAL HIGH (ref 65–99)
Potassium: 4.3 mmol/L (ref 3.5–5.3)
Sodium: 138 mmol/L (ref 135–146)
Total Bilirubin: 0.5 mg/dL (ref 0.2–1.2)
Total Protein: 6.6 g/dL (ref 6.1–8.1)
eGFR: 75 mL/min/1.73m2 (ref >=60)

## 2023-12-10 LAB — CBC WITH DIFFERENTIAL/PLATELET
Absolute Lymphocytes: 2097 {cells}/uL (ref 850–3900)
Absolute Monocytes: 610 {cells}/uL (ref 200–950)
Basophils Absolute: 60 {cells}/uL (ref 0–200)
Basophils Relative: 0.9 %
Eosinophils Absolute: 409 {cells}/uL (ref 15–500)
Eosinophils Relative: 6.1 %
HCT: 49.8 % (ref 38.5–50.0)
Hemoglobin: 17.2 g/dL — ABNORMAL HIGH (ref 13.2–17.1)
MCH: 33.7 pg — ABNORMAL HIGH (ref 27.0–33.0)
MCHC: 34.5 g/dL (ref 32.0–36.0)
MCV: 97.6 fL (ref 80.0–100.0)
MPV: 10.5 fL (ref 7.5–12.5)
Monocytes Relative: 9.1 %
Neutro Abs: 3524 {cells}/uL (ref 1500–7800)
Neutrophils Relative %: 52.6 %
Platelets: 238 10*3/uL (ref 140–400)
RBC: 5.1 Million/uL (ref 4.20–5.80)
RDW: 11.8 % (ref 11.0–15.0)
Total Lymphocyte: 31.3 %
WBC: 6.7 10*3/uL (ref 3.8–10.8)

## 2023-12-10 LAB — HEMOGLOBIN A1C
Hgb A1c MFr Bld: 5.9 % — ABNORMAL HIGH (ref <5.7)
Mean Plasma Glucose: 123 mg/dL
eAG (mmol/L): 6.8 mmol/L

## 2023-12-10 LAB — MICROALBUMIN / CREATININE URINE RATIO
Creatinine, Urine: 86 mg/dL (ref 20–320)
Microalb Creat Ratio: 2 mg/g{creat} (ref <30)
Microalb, Ur: 0.2 mg/dL

## 2023-12-10 LAB — LIPID PANEL
Cholesterol: 180 mg/dL (ref <200)
HDL: 54 mg/dL (ref >=40)
LDL Cholesterol (Calc): 105 mg/dL — ABNORMAL HIGH
Non-HDL Cholesterol (Calc): 126 mg/dL (ref <130)
Total CHOL/HDL Ratio: 3.3 (calc) (ref <5.0)
Triglycerides: 111 mg/dL (ref <150)

## 2023-12-10 LAB — PSA: PSA: 2.36 ng/mL (ref <=4.00)

## 2023-12-12 ENCOUNTER — Ambulatory Visit: Payer: Self-pay | Admitting: Family Medicine

## 2023-12-12 DIAGNOSIS — D0471 Carcinoma in situ of skin of right lower limb, including hip: Secondary | ICD-10-CM | POA: Diagnosis not present

## 2023-12-13 ENCOUNTER — Telehealth: Payer: Self-pay

## 2023-12-13 ENCOUNTER — Other Ambulatory Visit: Payer: Self-pay

## 2023-12-13 MED ORDER — TIRZEPATIDE 2.5 MG/0.5ML ~~LOC~~ SOAJ: 2.5000 mg | SUBCUTANEOUS | 1 refills | Status: DC

## 2023-12-13 NOTE — Telephone Encounter (Unsigned)
 Copied from CRM #[id number]. Topic: Clinical - Medication Question >> Dec 13, 2023  8:59 AM Andrew Cross wrote: Reason for CRM: tirzepatide  (MOUNJARO ) 2.5 MG/0.5ML Pen [[id number]] pt has been out about 2 weeks, points dropped about 2 point he stated. No other symptoms he mentioned. CB [phone] IF MISSED CALL PT ASKED TO LVM

## 2023-12-22 ENCOUNTER — Other Ambulatory Visit: Payer: Self-pay | Admitting: Family Medicine

## 2023-12-22 DIAGNOSIS — E1159 Type 2 diabetes mellitus with other circulatory complications: Secondary | ICD-10-CM

## 2023-12-22 DIAGNOSIS — I10 Essential (primary) hypertension: Secondary | ICD-10-CM

## 2023-12-22 DIAGNOSIS — E118 Type 2 diabetes mellitus with unspecified complications: Secondary | ICD-10-CM

## 2024-01-11 ENCOUNTER — Other Ambulatory Visit: Payer: Self-pay | Admitting: Family Medicine

## 2024-01-11 DIAGNOSIS — I10 Essential (primary) hypertension: Secondary | ICD-10-CM

## 2024-02-04 ENCOUNTER — Other Ambulatory Visit: Payer: Self-pay | Admitting: Family Medicine

## 2024-12-14 ENCOUNTER — Encounter: Admitting: Family Medicine
